# Patient Record
Sex: Male | Born: 1965 | Race: Black or African American | Hispanic: No | Marital: Married | Smoking: Current every day smoker
Health system: Southern US, Community
[De-identification: ages and names within clinical notes are randomized; demographics above are authoritative.]

## PROBLEM LIST (undated history)

## (undated) DIAGNOSIS — G459 Transient cerebral ischemic attack, unspecified: Secondary | ICD-10-CM

## (undated) DIAGNOSIS — I639 Cerebral infarction, unspecified: Secondary | ICD-10-CM

## (undated) DIAGNOSIS — I10 Essential (primary) hypertension: Secondary | ICD-10-CM

## (undated) DIAGNOSIS — C61 Malignant neoplasm of prostate: Secondary | ICD-10-CM

## (undated) DIAGNOSIS — I25118 Atherosclerotic heart disease of native coronary artery with other forms of angina pectoris: Secondary | ICD-10-CM

## (undated) DIAGNOSIS — G4730 Sleep apnea, unspecified: Secondary | ICD-10-CM

## (undated) HISTORY — PX: TONSILLECTOMY: SUR1361

## (undated) HISTORY — PX: COLONOSCOPY: SHX174

## (undated) HISTORY — PX: PROSTATE BIOPSY: SHX241

## (undated) HISTORY — PX: JOINT REPLACEMENT: SHX530

---

## 2008-07-13 ENCOUNTER — Ambulatory Visit: Payer: Self-pay | Admitting: Internal Medicine

## 2013-04-16 HISTORY — PX: HIP SURGERY: SHX245

## 2014-03-24 ENCOUNTER — Ambulatory Visit: Payer: Self-pay | Admitting: Orthopedic Surgery

## 2014-03-24 LAB — BASIC METABOLIC PANEL
ANION GAP: 8 (ref 7–16)
BUN: 12 mg/dL (ref 7–18)
CALCIUM: 8.7 mg/dL (ref 8.5–10.1)
CREATININE: 1.04 mg/dL (ref 0.60–1.30)
Chloride: 105 mmol/L (ref 98–107)
Co2: 25 mmol/L (ref 21–32)
EGFR (African American): >60
EGFR (Non-African Amer.): >60
GLUCOSE: 90 mg/dL (ref 65–99)
OSMOLALITY: 275 (ref 275–301)
POTASSIUM: 4.5 mmol/L (ref 3.5–5.1)
SODIUM: 138 mmol/L (ref 136–145)

## 2014-03-24 LAB — CBC
HCT: 40.7 % (ref 40.0–52.0)
HGB: 13.6 g/dL (ref 13.0–18.0)
MCH: 30.4 pg (ref 26.0–34.0)
MCHC: 33.3 g/dL (ref 32.0–36.0)
MCV: 91 fL (ref 80–100)
PLATELETS: 210 10*3/uL (ref 150–440)
RBC: 4.46 10*6/uL (ref 4.40–5.90)
RDW: 14.7 % — ABNORMAL HIGH (ref 11.5–14.5)
WBC: 4.6 10*3/uL (ref 3.8–10.6)

## 2014-03-24 LAB — URINALYSIS, COMPLETE
BACTERIA: NONE SEEN
Bilirubin,UR: NEGATIVE
Blood: NEGATIVE
Glucose,UR: NEGATIVE mg/dL (ref 0–75)
KETONE: NEGATIVE
Leukocyte Esterase: NEGATIVE
Nitrite: NEGATIVE
PROTEIN: NEGATIVE
Ph: 5 (ref 4.5–8.0)
SPECIFIC GRAVITY: 1.012 (ref 1.003–1.030)
Squamous Epithelial: NONE SEEN
WBC UR: 1 /HPF (ref 0–5)

## 2014-03-24 LAB — PROTIME-INR
INR: 0.9
Prothrombin Time: 12.1 secs (ref 11.5–14.7)

## 2014-03-24 LAB — SEDIMENTATION RATE: ERYTHROCYTE SED RATE: 28 mm/h — AB (ref 0–15)

## 2014-03-24 LAB — APTT: Activated PTT: 31.5 secs (ref 23.6–35.9)

## 2014-03-24 LAB — MRSA PCR SCREENING

## 2014-04-01 ENCOUNTER — Inpatient Hospital Stay: Payer: Self-pay | Admitting: Orthopedic Surgery

## 2014-04-02 LAB — BASIC METABOLIC PANEL
ANION GAP: 8 (ref 7–16)
BUN: 12 mg/dL (ref 7–18)
CHLORIDE: 106 mmol/L (ref 98–107)
CREATININE: 0.99 mg/dL (ref 0.60–1.30)
Calcium, Total: 7.8 mg/dL — ABNORMAL LOW (ref 8.5–10.1)
Co2: 24 mmol/L (ref 21–32)
EGFR (Non-African Amer.): >60
Glucose: 95 mg/dL (ref 65–99)
Osmolality: 275 (ref 275–301)
Potassium: 4 mmol/L (ref 3.5–5.1)
SODIUM: 138 mmol/L (ref 136–145)

## 2014-04-02 LAB — PLATELET COUNT: PLATELETS: 185 10*3/uL (ref 150–440)

## 2014-04-02 LAB — HEMOGLOBIN: HGB: 11.6 g/dL — ABNORMAL LOW (ref 13.0–18.0)

## 2014-04-26 ENCOUNTER — Encounter: Payer: Self-pay | Admitting: Orthopedic Surgery

## 2014-05-17 ENCOUNTER — Encounter: Payer: Self-pay | Admitting: Orthopedic Surgery

## 2014-08-07 NOTE — Op Note (Signed)
PATIENT NAME:  Bradley Shaw, Bradley Shaw MR#:  [id number] DATE OF BIRTH:  [birth year]  DATE OF PROCEDURE:  04/01/2014  PREOPERATIVE DIAGNOSIS: Right hip severe osteoarthritis.   POSTOPERATIVE DIAGNOSIS:  Right hip severe osteoarthritis.  PROCEDURE: Right total hip replacement, direct anterior approach.   ANESTHESIA: Spinal.   SURGEON: Hessie Knows, MD   DESCRIPTION OF PROCEDURE: The patient was brought to the Operating Room and after adequate spinal anesthesia was obtained, the patient was placed on the operative table with the right foot in the Medacta attachment, left foot on a well-padded table.  Preop x-rays were taken of both sides because of the severe deformity on the right side for subsequent templating. After prepping and draping in the usual sterile fashion, appropriate patient identification and timeout procedures were completed.  Direct anterior approach was made, centered over the TFL and greater trochanter with C-arm being brought to help localize.  Case was made more difficult by the patient's size and muscularity, as well as a very stiff hip.  After exposing the TFL the tensor was retracted laterally and deep retractors placed. The fascia adjacent to the quadriceps muscles was exposed and the large lateral femoral circumflex vessels were identified and ligated.   Next, the anterior capsule was exposed and a capsulotomy performed. There was a large loose body anteriorly.  Femoral neck cut was carried out with a subsequent and second cut to allow for adequate exposure of the neck. The head was very difficult to remove because there was essentially no motion in the acetabulum with the head.  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 This was also true on the acetabular side with significant deformity bone loss of sclerotic bone.  Labrum was excised and reaming carried out to 52 mm, at which point there was good bleeding bone. A 52 mm trial  fit well.  A 52 mm cup was impacted into place.   Attention was then turned to the femur.  Pubofemoral and extensive ischiofemoral releases were required because of the patient's stiffness, also because of his dense bone, it was difficult to get started in the canal, but after working on getting the canal opened and using offset handle, I was able to subsequently broach up to the size 3 which was the appropriate size with a small head on trial with the appropriate Versafit Cup trial.  With the 3 stem in place, a size S head and 52 mm Versafit Cup liner were assembled, impacted onto the stem and the hip was reduced.  AP images with the leg in external and internal rotation showed good component position. The wound was thoroughly irrigated and the hip closed with a running heavy Quill suture for the superficial fascia over the tensor.  Drains were placed and then the skin was closed with 2-0 Vicryl subcutaneously and skin staples, followed by a sterile dressing.  The patient was sent to the recovery room in stable condition.   ESTIMATED BLOOD LOSS: 500 mL.   COMPLICATIONS: None.   SPECIMEN: Removed loose bodies and femoral head.   IMPLANTS: Medacta Versafit cup DM, 52 mm with associated liner and a size 3 standard Amis stem S 28 mm head.  CONDITION:  Stable to recovery room.      ____________________________ Laurene Footman, MD mjm:DT D: 04/01/2014 23:08:06 ET T: 04/02/2014 09:26:05 ET JOB#: [id number]  cc: Laurene Footman, MD, <Dictator> Laurene Footman MD ELECTRONICALLY SIGNED 04/02/2014 13:11

## 2014-08-09 LAB — SURGICAL PATHOLOGY

## 2014-08-11 NOTE — H&P (Signed)
PATIENT NAME:  Bradley Shaw, Bradley Shaw MR#:  [id number] DATE OF BIRTH:  [birth year]  DATE OF ADMISSION:  04/01/2014  DATE OF DISCHARGE: 04/05/1999.   ADMITTING DIAGNOSIS: Right hip severe osteoarthritis.   DISCHARGE DIAGNOSIS: Right hip severe osteoarthritis.   OPERATION: On 04/01/2014, the patient had a right total hip replacement using anterior approach.   ANESTHESIA: Spinal.   ESTIMATED BLOOD LOSS: 500 mL.   COMPLICATIONS: None.  IMPLANTS USED:  Medacta Versafit cup DM, 52 mm with associated liner and a size three standard AMI Stem S 28 mm head. The patient was stabilized, brought to the recovery room, and then brought down to the orthopedic floor.   HISTORY: The patient is a 50 year old male that presented for persistent severe pain involving his right hip and leg. The patient has been refractory to conservative treatment with activity modifications and has had no cortisone injections.   PHYSICAL EXAMINATION:  GENERAL: Alert male with severe limp and a very slow gait. The patient has a hip lurch on the right.   CARDIAC: Regular rate and rhythm.   LUNGS: Clear to auscultation.   MUSCULOSKELETAL: In regard to the right leg the patient has anterior hip joint pain. The patient has 90 degrees flexion with -30 degrees extension. The patient has internal rotation of 30 degrees and external rotation and 30 degrees with pain. The patient has a benign knee and ankle exam with neurovascular intact.   HOSPITAL COURSE: After initial admission on 04/01/2014, the patient was brought to the orthopedic floor. On postoperative day one, the patient had a hemoglobin of 11.6 which remained stable there. The patient had no drain use.  The patient was able to ambulate with physical therapy, initially bed to chair and progressed up to ambulating over 200 feet, including doing stairs before discharge. The patient was ready to go home with home health physical therapy on 04/04/2014.   CONDITION AT DISCHARGE:  Stable.   DISPOSITION: The patient was sent home with home health physical therapy.   DISCHARGE INSTRUCTIONS: The patient will follow up at [place] in two weeks. The patient will do weight bear as tolerated on the affected leg. The patient will elevate the leg on 1 to 2 pillows and use knee-high TED hose on both legs, removed at bedtime. The patient will elevate his heels off the bed and use incentive spirometer, as well as do cough and deep breathing. The patient's diet is regular. The patient will try to keep his dressing clean and dry and will have it changed by physical therapy if needed. The patient will call the clinic if there is any bright red bleeding or calf pain, or bowel or bladder difficulty. The patient will follow-up with [facility] orthopedics in two weeks.   DISCHARGE MEDICATIONS:  Tylenol 500 mg 1 tablet every four hours as needed for fever, oxycodone 5 mg 1 tablet q.4 hours as needed for moderate to severe pain and Lovenox 40 mg subcutaneous once a day for 14 days and then discontinue and begin aspirin 81 mg once a day.   ____________________________ Lenna Sciara. Reche Dixon, [place] jtm:at D: 04/04/2014 06:47:31 ET T: 04/04/2014 17:09:36 ET JOB#: [id number]  cc: J. Reche Dixon, [place], <Dictator> J Aleyza Salmi [facility] [place] ELECTRONICALLY SIGNED 04/05/2014 8:24

## 2014-08-15 NOTE — Discharge Summary (Signed)
PATIENT NAME:  Bradley Shaw, Bradley Shaw MR#:  [id number] DATE OF BIRTH:  1965-12-07  DATE OF ADMISSION:  04/01/2014 DATE OF DISCHARGE: 04/04/2014   ADMITTING DIAGNOSIS: Right hip severe osteoarthritis.   DISCHARGE DIAGNOSIS: Right hip severe osteoarthritis.   OPERATION: On 04/01/2014, the patient had a right total hip replacement using anterior approach.   ANESTHESIA: Spinal.   ESTIMATED BLOOD LOSS: 500 mL.   COMPLICATIONS: None.  IMPLANTS USED:  Medacta Versafit cup DM, 52 mm with associated liner and a size three standard AMI Stem S 28 mm head. The patient was stabilized, brought to the recovery room, and then brought down to the orthopedic floor.   HISTORY: The patient is a 49 year old male that presented for persistent severe pain involving his right hip and leg. The patient has been refractory to conservative treatment with activity modifications and has had no cortisone injections.   PHYSICAL EXAMINATION:  GENERAL: Alert male with severe limp and a very slow gait. The patient has a hip lurch on the right.   CARDIAC: Regular rate and rhythm.   LUNGS: Clear to auscultation.   MUSCULOSKELETAL: In regard to the right leg the patient has anterior hip joint pain. The patient has 90 degrees flexion with -30 degrees extension. The patient has internal rotation of 30 degrees and external rotation and 30 degrees with pain. The patient has a benign knee and ankle exam with neurovascular intact.   HOSPITAL COURSE: After initial admission on 04/01/2014, the patient was brought to the orthopedic floor. On postoperative day one, the patient had a hemoglobin of 11.6 which remained stable there. The patient had no drain use.  The patient was able to ambulate with physical therapy, initially bed to chair and progressed up to ambulating over 200 feet, including doing stairs before discharge. The patient was ready to go home with home health physical therapy on 04/04/2014.   CONDITION AT DISCHARGE: Stable.    DISPOSITION: The patient was sent home with home health physical therapy.   DISCHARGE INSTRUCTIONS: The patient will follow up at [facility] in two weeks. The patient will do weight bear as tolerated on the affected leg. The patient will elevate the leg on 1 to 2 pillows and use knee-high TED hose on both legs, removed at bedtime. The patient will elevate his heels off the bed and use incentive spirometer, as well as do cough and deep breathing. The patient's diet is regular. The patient will try to keep his dressing clean and dry and will have it changed by physical therapy if needed. The patient will call the clinic if there is any bright red bleeding or calf pain, or bowel or bladder difficulty. The patient will follow-up with [facility] orthopedics in two weeks.   DISCHARGE MEDICATIONS:  Tylenol 500 mg 1 tablet every four hours as needed for fever, oxycodone 5 mg 1 tablet q.4 hours as needed for moderate to severe pain and Lovenox 40 mg subcutaneous once a day for 14 days and then discontinue and begin aspirin 81 mg once a day.   ____________________________ Lenna Sciara. Reche Dixon, [place] jtm:at D: 04/04/2014 06:47:31 ET T: 04/04/2014 17:09:36 ET JOB#: [id number]  cc: J. Reche Dixon, [place], <Dictator> J Jerral Mccauley [facility] PA ELECTRONICALLY SIGNED 04/05/2014 8:24

## 2015-06-09 ENCOUNTER — Other Ambulatory Visit: Payer: Self-pay | Admitting: Family Medicine

## 2015-06-09 ENCOUNTER — Ambulatory Visit
Admission: RE | Admit: 2015-06-09 | Discharge: 2015-06-09 | Disposition: A | Discharge status: Home / Self Care | Payer: Managed Care, Other (non HMO) | Source: Ambulatory Visit | Attending: Family Medicine | Admitting: Family Medicine

## 2015-06-09 DIAGNOSIS — Z96641 Presence of right artificial hip joint: Secondary | ICD-10-CM | POA: Insufficient documentation

## 2015-06-09 DIAGNOSIS — M25551 Pain in right hip: Secondary | ICD-10-CM

## 2015-06-22 ENCOUNTER — Other Ambulatory Visit: Payer: Self-pay | Admitting: Orthopedic Surgery

## 2015-06-22 DIAGNOSIS — Z96641 Presence of right artificial hip joint: Secondary | ICD-10-CM

## 2015-06-22 DIAGNOSIS — M25551 Pain in right hip: Secondary | ICD-10-CM

## 2015-06-27 ENCOUNTER — Ambulatory Visit
Admission: RE | Admit: 2015-06-27 | Discharge: 2015-06-27 | Disposition: A | Discharge status: Home / Self Care | Payer: Managed Care, Other (non HMO) | Source: Ambulatory Visit | Attending: Orthopedic Surgery | Admitting: Orthopedic Surgery

## 2015-06-27 ENCOUNTER — Encounter
Admission: RE | Admit: 2015-06-27 | Discharge: 2015-06-27 | Disposition: A | Discharge status: Home / Self Care | Payer: Managed Care, Other (non HMO) | Source: Ambulatory Visit | Attending: Orthopedic Surgery | Admitting: Orthopedic Surgery

## 2015-06-27 DIAGNOSIS — Z96641 Presence of right artificial hip joint: Secondary | ICD-10-CM | POA: Insufficient documentation

## 2015-06-27 DIAGNOSIS — M25551 Pain in right hip: Secondary | ICD-10-CM | POA: Insufficient documentation

## 2015-06-27 MED ORDER — TECHNETIUM TC 99M MEDRONATE IV KIT
23.7000 | PACK | Freq: Once | INTRAVENOUS | Status: AC | PRN
  Administered 2015-06-27: 23.7 via INTRAVENOUS

## 2015-12-28 DIAGNOSIS — E669 Obesity, unspecified: Secondary | ICD-10-CM | POA: Insufficient documentation

## 2016-01-11 ENCOUNTER — Encounter: Payer: Self-pay | Admitting: Urology

## 2016-01-11 ENCOUNTER — Ambulatory Visit (INDEPENDENT_AMBULATORY_CARE_PROVIDER_SITE_OTHER): Payer: Managed Care, Other (non HMO) | Admitting: Urology

## 2016-01-11 VITALS — BP 130/78 | HR 89 | Ht 68.0 in | Wt 265.3 lb

## 2016-01-11 DIAGNOSIS — R972 Elevated prostate specific antigen [PSA]: Secondary | ICD-10-CM

## 2016-01-11 LAB — URINALYSIS, COMPLETE
BILIRUBIN UA: NEGATIVE
GLUCOSE, UA: NEGATIVE
KETONES UA: NEGATIVE
Leukocytes, UA: NEGATIVE
NITRITE UA: NEGATIVE
Protein, UA: NEGATIVE
RBC UA: NEGATIVE
SPEC GRAV UA: 1.025 (ref 1.005–1.030)
UUROB: 1 mg/dL (ref 0.2–1.0)
pH, UA: 6.5 (ref 5.0–7.5)

## 2016-01-11 LAB — MICROSCOPIC EXAMINATION: Bacteria, UA: NONE SEEN

## 2016-01-11 NOTE — Progress Notes (Signed)
01/11/2016 12:27 PM   Bradley Shaw 03-31-1966 [id number]  Referring provider: Valera Castle, MD [address] [place], [place] [address]  Chief Complaint  Patient presents with  . New Patient (Initial Visit)    elevated PSA    HPI: Pt seen today for elevated PSA. His Dec 28, 2015 PSA was 12.47. He does not recall prior PSA or prior elevated PSA. No FH PCa. He drives a fork lift. He has no LUTS. He tried Viagra for ED a week ago. It helped some. He's had trouble maintaining an erection for about a year.   He has three kids.   UA today clear, normal.   PMH: No past medical history on file.  Surgical History: Past Surgical History:  Procedure Laterality Date  . HIP SURGERY Right 2015    Home Medications:    Medication List       Accurate as of 01/11/16 12:27 PM. Always use your most recent med list.          meloxicam 15 MG tablet Commonly known as:  MOBIC   sildenafil 20 MG tablet Commonly known as:  REVATIO May use 1-5 tabs daily 1/2 hr before intercourse       Allergies:  Allergies  Allergen Reactions  . Shellfish Allergy Anaphylaxis    Hives & throat closes    Family History: Family History  Problem Relation Age of Onset  . Prostate cancer Neg Hx     Social History:  reports that he has been smoking.  He has never used smokeless tobacco. He reports that he drinks alcohol. He reports that he does not use drugs.  ROS: UROLOGY Frequent Urination?: No Hard to postpone urination?: No Burning/pain with urination?: No Get up at night to urinate?: No Leakage of urine?: No Urine stream starts and stops?: No Trouble starting stream?: No Do you have to strain to urinate?: No Blood in urine?: No Urinary tract infection?: No Sexually transmitted disease?: Yes Injury to kidneys or bladder?: No Painful intercourse?: No Weak stream?: No Erection problems?: No Penile pain?: No  Gastrointestinal Nausea?: No Vomiting?:  No Indigestion/heartburn?: No Diarrhea?: No Constipation?: No  Constitutional Fever: No Night sweats?: No Weight loss?: No Fatigue?: No  Skin Skin rash/lesions?: No Itching?: No  Eyes Blurred vision?: No Double vision?: No  Ears/Nose/Throat Sore throat?: No Sinus problems?: No  Hematologic/Lymphatic Swollen glands?: No Easy bruising?: No  Cardiovascular Leg swelling?: No Chest pain?: No  Respiratory Cough?: No Shortness of breath?: No  Endocrine Excessive thirst?: No  Musculoskeletal Back pain?: No Joint pain?: No  Neurological Headaches?: No Dizziness?: No  Psychologic Depression?: No Anxiety?: No  Physical Exam: BP 130/78   Pulse 89   Ht 5\' 8"  (1.727 m)   Wt 120.3 kg (265 lb 4.8 oz)   BMI 40.34 kg/m   Constitutional:  Alert and oriented, No acute distress. HEENT: [place] AT, moist mucus membranes.  Trachea midline, no masses. Cardiovascular: No clubbing, cyanosis, or edema. Respiratory: Normal respiratory effort, no increased work of breathing. GI: Abdomen is soft, nontender, nondistended, no abdominal masses Skin: No rashes, bruises or suspicious lesions. Lymph: No cervical or inguinal adenopathy. Neurologic: Grossly intact, no focal deficits, moving all 4 extremities. Psychiatric: Normal mood and affect. DRE- prostate 30 grams, no hard area or nodule. Landmarks preserved.   Laboratory Data: Lab Results  Component Value Date   WBC 4.6 03/24/2014   HGB 11.6 (L) 04/02/2014   HCT 40.7 03/24/2014   MCV 91 03/24/2014  PLT 185 04/02/2014    Lab Results  Component Value Date   CREATININE 0.99 04/02/2014    No results found for: PSA  No results found for: TESTOSTERONE  No results found for: HGBA1C  Urinalysis    Component Value Date/Time   COLORURINE Yellow 03/24/2014 0904   APPEARANCEUR Clear 03/24/2014 0904   LABSPEC 1.012 03/24/2014 0904   PHURINE 5.0 03/24/2014 0904   GLUCOSEU Negative 03/24/2014 0904   HGBUR Negative  03/24/2014 0904   BILIRUBINUR Negative 03/24/2014 0904   KETONESUR Negative 03/24/2014 0904   PROTEINUR Negative 03/24/2014 0904   NITRITE Negative 03/24/2014 0904   LEUKOCYTESUR Negative 03/24/2014 0904     Assessment & Plan:    1) PSA elevation - discussed with pt nature of elevated PSA and potential etiologies - cancer vs benign. Discussed age specific levels - 2.5. Discussed nature r/b of surveillance, other labs, prostate MRI, and prostate biopsy. Discussed management of PCa might include AS vs treatment with surgery or radiation. Discussed tx can have a lot of side effects - LUTS, worse ED, etc. Will recheck PSA in 2 weeks with 48 hrs abstinence. If PSA     There are no diagnoses linked to this encounter.  No Follow-up on file.  Festus Aloe, [facility] [address], [place], [place] [address] [phone]

## 2016-01-27 ENCOUNTER — Other Ambulatory Visit: Payer: Managed Care, Other (non HMO)

## 2016-03-12 ENCOUNTER — Other Ambulatory Visit: Payer: Managed Care, Other (non HMO)

## 2016-03-16 ENCOUNTER — Other Ambulatory Visit: Payer: Managed Care, Other (non HMO)

## 2016-03-16 DIAGNOSIS — R972 Elevated prostate specific antigen [PSA]: Secondary | ICD-10-CM

## 2016-03-17 LAB — PSA TOTAL (REFLEX TO FREE): Prostate Specific Ag, Serum: 13.6 ng/mL — ABNORMAL HIGH (ref 0.0–4.0)

## 2016-03-21 ENCOUNTER — Telehealth: Payer: Self-pay

## 2016-03-21 NOTE — Telephone Encounter (Signed)
Bradley Aloe, MD  Lestine Box, LPN        Notify patient his PSA has risen to 13.6. I recommend we proceed with prostate biopsy as discussed.   Please schedule for a prostate biopsy.   Thanks.    LMOM

## 2016-03-21 NOTE — Telephone Encounter (Signed)
Spoke with pt in reference to PSA results and needing a prostate bx. Pt voiced understanding. Pt was transferred to the front to make bx appt and cancel upcoming appt.

## 2016-03-26 ENCOUNTER — Ambulatory Visit: Payer: Managed Care, Other (non HMO)

## 2016-04-23 ENCOUNTER — Ambulatory Visit (INDEPENDENT_AMBULATORY_CARE_PROVIDER_SITE_OTHER): Payer: Managed Care, Other (non HMO) | Admitting: Urology

## 2016-04-23 ENCOUNTER — Encounter: Payer: Self-pay | Admitting: Urology

## 2016-04-23 ENCOUNTER — Other Ambulatory Visit: Payer: Self-pay | Admitting: Urology

## 2016-04-23 VITALS — BP 121/77 | HR 75 | Ht 67.0 in | Wt 246.2 lb

## 2016-04-23 DIAGNOSIS — R972 Elevated prostate specific antigen [PSA]: Secondary | ICD-10-CM | POA: Diagnosis not present

## 2016-04-23 DIAGNOSIS — N5201 Erectile dysfunction due to arterial insufficiency: Secondary | ICD-10-CM | POA: Diagnosis not present

## 2016-04-23 MED ORDER — GENTAMICIN SULFATE 40 MG/ML IJ SOLN
80.0000 mg | Freq: Once | INTRAMUSCULAR | Status: AC
  Administered 2016-04-23: 80 mg via INTRAMUSCULAR

## 2016-04-23 MED ORDER — LIDOCAINE HCL 2 % EX GEL
1.0000 "application " | Freq: Once | CUTANEOUS | Status: AC
  Administered 2016-04-23: 1 via URETHRAL

## 2016-04-23 MED ORDER — LEVOFLOXACIN 500 MG PO TABS
500.0000 mg | ORAL_TABLET | Freq: Once | ORAL | Status: AC
  Administered 2016-04-23: 500 mg via ORAL

## 2016-04-23 NOTE — Progress Notes (Signed)
04/23/2016 5:46 AM   Bradley Shaw 04/08/1966 [id number]  Referring provider: Valera Castle, MD [place], [place] [address]  CC: prostate biopsy  HPI:   1. Elevated PSA - No [facility] prostate cancer. 12/2015 - PSA 12.47 (pcp labs) ===> 03/2016 recheck 13.6 ===> 04/2016 TRUS BX 34 mL, no median lobe  2 - Erectile Dysfunction - on sildenafil prn for trouble maintaining erection. Libido preserved.  Today Bradley Shaw is seen to proceed with prostate biopsy for persistently elevated PSA.   PMH: No past medical history on file.  Surgical History: Past Surgical History:  Procedure Laterality Date  . HIP SURGERY Right 2015    Home Medications:  Allergies as of 04/23/2016      Reactions   Shellfish Allergy Anaphylaxis   Hives & throat closes      Medication List       Accurate as of 04/23/16  5:46 AM. Always use your most recent med list.          meloxicam 15 MG tablet Commonly known as:  MOBIC   sildenafil 20 MG tablet Commonly known as:  REVATIO May use 1-5 tabs daily 1/2 hr before intercourse       Allergies:  Allergies  Allergen Reactions  . Shellfish Allergy Anaphylaxis    Hives & throat closes    Family History: Family History  Problem Relation Age of Onset  . Prostate cancer Neg Hx     Social History:  reports that he has been smoking.  He has never used smokeless tobacco. He reports that he drinks alcohol. He reports that he does not use drugs.    Review of Systems  Gastrointestinal (upper)  : Negative for upper GI symptoms  Gastrointestinal (lower) : Negative for lower GI symptoms  Constitutional : Negative for symptoms  Skin: Negative for skin symptoms  Eyes: Negative for eye symptoms  Ear/Nose/Throat : Negative for Ear/Nose/Throat symptoms  Hematologic/Lymphatic: Negative for Hematologic/Lymphatic symptoms  Cardiovascular : Negative for cardiovascular symptoms  Respiratory : Negative for respiratory  symptoms  Endocrine: Negative for endocrine symptoms  Musculoskeletal: Negative for musculoskeletal symptoms  Neurological: Negative for neurological symptoms  Psychologic: Negative for psychiatric symptoms  Physical Exam: There were no vitals taken for this visit.  Constitutional:  Alert and oriented, No acute distress. HEENT: [place] AT, moist mucus membranes.  Trachea midline, no masses. Cardiovascular: No clubbing, cyanosis, or edema. Respiratory: Normal respiratory effort, no increased work of breathing. GI: Abdomen is soft, nontender, nondistended, no abdominal masses GU: No CVA tenderness.  Skin: No rashes, bruises or suspicious lesions. Lymph: No cervical or inguinal adenopathy. Neurologic: Grossly intact, no focal deficits, moving all 4 extremities. Psychiatric: Normal mood and affect.  Laboratory Data: Lab Results  Component Value Date   WBC 4.6 03/24/2014   HGB 11.6 (L) 04/02/2014   HCT 40.7 03/24/2014   MCV 91 03/24/2014   PLT 185 04/02/2014    Lab Results  Component Value Date   CREATININE 0.99 04/02/2014    No results found for: PSA  No results found for: TESTOSTERONE  No results found for: HGBA1C  Urinalysis    Component Value Date/Time   COLORURINE Yellow 03/24/2014 0904   APPEARANCEUR Clear 01/11/2016 1331   LABSPEC 1.012 03/24/2014 0904   PHURINE 5.0 03/24/2014 0904   GLUCOSEU Negative 01/11/2016 1331   GLUCOSEU Negative 03/24/2014 0904   HGBUR Negative 03/24/2014 0904   BILIRUBINUR Negative 01/11/2016 1331   BILIRUBINUR Negative 03/24/2014 0904  KETONESUR Negative 03/24/2014 0904   PROTEINUR Negative 01/11/2016 1331   PROTEINUR Negative 03/24/2014 0904   NITRITE Negative 01/11/2016 1331   NITRITE Negative 03/24/2014 0904   LEUKOCYTESUR Negative 01/11/2016 1331   LEUKOCYTESUR Negative 03/24/2014 0904    Prostate Biopsy Procedure   Informed consent was obtained after discussing risks/benefits of the procedure.  A time out was  performed to ensure correct patient identity.  Pre-Procedure: - Last PSA Level: No results found for: PSA - Gentamicin given prophylactically - Levaquin 500 mg administered PO -Transrectal Ultrasound performed revealing a 34 gm prostate -No significant hypoechoic or median lobe noted  Procedure: - Prostate block performed using 10 cc 1% lidocaine and biopsies taken from sextant areas, a total of 12 under ultrasound guidance.  Post-Procedure: - Patient tolerated the procedure well - He was counseled to seek immediate medical attention if experiences any severe pain, significant bleeding, or fevers - Return in one week to discuss biopsy results   Assessment & Plan:   1. Elevated PSA - s/p biopsy today as per above. Warned to contact MD for fever >101, perisstantly bleeding with dizziness, or inability to void. RTC for results discussion in 1-2 weeks.   2. Erectile dysfunction due to arterial insufficiency - continue sildenafil PRN  Alexis Frock, [place] [address], [place], [place] [address] [phone]

## 2016-04-25 LAB — PATHOLOGY REPORT

## 2016-04-30 ENCOUNTER — Other Ambulatory Visit: Payer: Self-pay | Admitting: Urology

## 2016-05-01 ENCOUNTER — Ambulatory Visit (INDEPENDENT_AMBULATORY_CARE_PROVIDER_SITE_OTHER): Payer: Managed Care, Other (non HMO) | Admitting: Urology

## 2016-05-01 ENCOUNTER — Ambulatory Visit: Payer: Managed Care, Other (non HMO)

## 2016-05-01 ENCOUNTER — Encounter: Payer: Self-pay | Admitting: Urology

## 2016-05-01 VITALS — BP 133/82 | HR 80 | Ht 67.0 in | Wt 248.2 lb

## 2016-05-01 DIAGNOSIS — C61 Malignant neoplasm of prostate: Secondary | ICD-10-CM | POA: Insufficient documentation

## 2016-05-01 DIAGNOSIS — N5201 Erectile dysfunction due to arterial insufficiency: Secondary | ICD-10-CM | POA: Diagnosis not present

## 2016-05-01 DIAGNOSIS — R972 Elevated prostate specific antigen [PSA]: Secondary | ICD-10-CM | POA: Diagnosis not present

## 2016-05-01 NOTE — Progress Notes (Signed)
05/01/2016 10:18 AM   [name] 1965/05/23 [id number]  Referring provider: Valera Castle, MD [address] [place], [place] [address]  [id number] up / discuss prostate cance.r   HPI:   1. 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 Bilateral apical, lateral and base positivity.   2 - Erectile Dysfunction - on sildenafil prn for trouble maintaining erection. Libido preserved.  Today Mr. Piersol is seen in f/u above and discuss new large volume prostate cancer. He is nearly completely back to baseline following recent biopsy.   PMH: No past medical history on file.  Surgical History: Past Surgical History:  Procedure Laterality Date  . HIP SURGERY Right 2015    Home Medications:  Allergies as of 05/01/2016      Reactions   Shellfish Allergy Anaphylaxis   Hives & throat closes      Medication List       Accurate as of 05/01/16 10:18 AM. Always use your most recent med list.          meloxicam 15 MG tablet Commonly known as:  MOBIC   sildenafil 20 MG tablet Commonly known as:  REVATIO May use 1-5 tabs daily 1/2 hr before intercourse       Allergies:  Allergies  Allergen Reactions  . Shellfish Allergy Anaphylaxis    Hives & throat closes    Family History: Family History  Problem Relation Age of Onset  . Prostate cancer Neg Hx     Social History:  reports that he has been smoking.  He has never used smokeless tobacco. He reports that he drinks alcohol. He reports that he does not use drugs.    Review of Systems  Gastrointestinal (upper)  : Negative for upper GI symptoms  Gastrointestinal (lower) : Negative for lower GI symptoms  Constitutional : Negative for symptoms  Skin: Negative for skin symptoms  Eyes: Negative for eye symptoms  Ear/Nose/Throat : Negative for Ear/Nose/Throat  symptoms  Hematologic/Lymphatic: Negative for Hematologic/Lymphatic symptoms  Cardiovascular : Negative for cardiovascular symptoms  Respiratory : Negative for respiratory symptoms  Endocrine: Negative for endocrine symptoms  Musculoskeletal: Negative for musculoskeletal symptoms  Neurological: Negative for neurological symptoms  Psychologic: Negative for psychiatric symptoms  Physical Exam: There were no vitals taken for this visit.  Constitutional:  Alert and oriented, No acute distress. HEENT: [place] AT, moist mucus membranes.  Trachea midline, no masses. Cardiovascular: No clubbing, cyanosis, or edema. Respiratory: Normal respiratory effort, no increased work of breathing. GI: Abdomen is soft, nontender, nondistended, no abdominal masses GU: No CVA tenderness.  Skin: No rashes, bruises or suspicious lesions. Lymph: No cervical or inguinal adenopathy. Neurologic: Grossly intact, no focal deficits, moving all 4 extremities. Psychiatric: Normal mood and affect.  Laboratory Data: Lab Results  Component Value Date   WBC 4.6 03/24/2014   HGB 11.6 (L) 04/02/2014   HCT 40.7 03/24/2014   MCV 91 03/24/2014   PLT 185 04/02/2014    Lab Results  Component Value Date   CREATININE 0.99 04/02/2014    No results found for: PSA  No results found for: TESTOSTERONE  No results found for: HGBA1C  Urinalysis    Component Value Date/Time   COLORURINE Yellow 03/24/2014 0904   APPEARANCEUR Clear 01/11/2016 1331   LABSPEC 1.012 03/24/2014 0904   PHURINE 5.0 03/24/2014 0904   GLUCOSEU Negative  01/11/2016 1331   GLUCOSEU Negative 03/24/2014 0904   HGBUR Negative 03/24/2014 0904   BILIRUBINUR Negative 01/11/2016 1331   BILIRUBINUR Negative 03/24/2014 0904   KETONESUR Negative 03/24/2014 0904   PROTEINUR Negative 01/11/2016 1331   PROTEINUR Negative 03/24/2014 0904   NITRITE Negative 01/11/2016 1331   NITRITE Negative 03/24/2014 0904   LEUKOCYTESUR Negative 01/11/2016 1331    LEUKOCYTESUR Negative 03/24/2014 0904     Assessment & Plan:   1. 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 Very high liklihood of need for eventual multimodal therapy regardelss of primary approach.  In terms of primary therapy, discussed radiation + 6mos ADT v. Surgery (non-nerve-sparing, with pelvic lymphadenectomy) in detail with surgery usually being preferred in men his age given somewhat more aggressive oncologically and less risk of long term secondary malignancy. Sexual function and urinary funciton side effects frankly discussed as well.   Offered referral to radiation oncology to discussed XRT/ADT in more detail, and recommended discussion with them, he is amenable.  2. Erectile dysfunction due to arterial insufficiency - continue sildenafil PRN, again discussed that this would be expected to worsen with any primary therapy with prostate cancer.  3 - RTC 4 weeks to rediscuss primary therapy options with rad-onc visit in interval.   Alexis Frock, [facility] [address], [place], [place] [address] [phone]

## 2016-05-09 ENCOUNTER — Telehealth: Payer: Self-pay | Admitting: Urology

## 2016-05-09 NOTE — Telephone Encounter (Signed)
-----   Message from Wallene Dales sent at 05/09/2016  8:20 AM EST ----- Regarding: Referral Patient declined to have the appt at the designated time. He stated he would call us back to reschedule once he made up time from work due to snow.

## 2016-05-14 ENCOUNTER — Institutional Professional Consult (permissible substitution): Payer: Managed Care, Other (non HMO) | Admitting: Radiation Oncology

## 2016-05-31 ENCOUNTER — Ambulatory Visit: Payer: Managed Care, Other (non HMO)

## 2016-06-01 ENCOUNTER — Telehealth: Payer: Self-pay

## 2016-06-01 NOTE — Telephone Encounter (Signed)
  Oncology Nurse Navigator Documentation Reached out to Bradley Shaw since he has not called to reschedule appointment with Dr. Baruch Gouty and noted that he does not have any follow up appts with Dr. Tresa Moore. Encouraged him to follow up regarding treatment whether it be here with Dr. Baruch Gouty or to follow up with Dr. Tresa Moore. He stated he would like to go ahead and make appt with Dr. Baruch Gouty as he has done some research and is interested in "seed placement". Appointment made by radiation for 2/19 at 0830. Navigator Location: [facility] (06/01/16 1300)   )Navigator Encounter Type: Telephone;Treatment (06/01/16 1300) Telephone: [place] Call (06/01/16 1300)                       Barriers/Navigation Needs: Coordination of Care (06/01/16 1300)   Interventions: Coordination of Care (06/01/16 1300)   Coordination of Care: Appts (06/01/16 1300)        Acuity: Level 2 (06/01/16 1300)   Acuity Level 2: Initial guidance, education and coordination as needed;Educational needs;Assistance expediting appointments;Ongoing guidance and education throughout treatment as needed (06/01/16 1300)     Time Spent with Patient: 15 (06/01/16 1300)

## 2016-06-05 ENCOUNTER — Encounter: Payer: Self-pay | Admitting: Radiation Oncology

## 2016-06-05 ENCOUNTER — Ambulatory Visit
Admission: RE | Admit: 2016-06-05 | Discharge: 2016-06-05 | Disposition: A | Discharge status: Home / Self Care | Payer: Managed Care, Other (non HMO) | Source: Ambulatory Visit | Attending: Radiation Oncology | Admitting: Radiation Oncology

## 2016-06-05 ENCOUNTER — Encounter (INDEPENDENT_AMBULATORY_CARE_PROVIDER_SITE_OTHER): Payer: Self-pay

## 2016-06-05 VITALS — BP 121/78 | HR 85 | Temp 98.7°F | Resp 18 | Wt 246.7 lb

## 2016-06-05 DIAGNOSIS — C61 Malignant neoplasm of prostate: Secondary | ICD-10-CM | POA: Insufficient documentation

## 2016-06-05 DIAGNOSIS — Z79899 Other long term (current) drug therapy: Secondary | ICD-10-CM | POA: Insufficient documentation

## 2016-06-05 NOTE — Consult Note (Signed)
NEW PATIENT EVALUATION  Name: Bradley Shaw  MRN: [id number]  Date:   06/05/2016     DOB: 09-Sep-1965   This 51 y.o. male patient presents to the clinic for initial evaluation of adenocarcinoma the prostate stage II Gleason score of 7 (3+4) presenting the PSA of 13.6.  REFERRING PHYSICIAN: Valera Castle, *  CHIEF COMPLAINT:  Chief Complaint  Patient presents with  . Prostate Cancer    Pt is here for initial consultation of prostate cancer.       DIAGNOSIS: The encounter diagnosis was Malignant neoplasm of prostate ([place]).   PREVIOUS INVESTIGATIONS:  Bone scan reviewed Clinical notes reviewed Pathology reports reviewed  HPI: Patient is a 51 year old male who presented with a PSA of 13.6. He was seen by urology which prompted a transrectal ultrasound-guided biopsy. 8 of 12 cores were positive for Gleason 7 (3+4) adenocarcinoma prostate measured 34.0 cc. Patient does have some erectile dysfunction is on sildenafil. He has been seen by urology and treatment options have been discussed. He has very little lower urinary tract symptoms. He has no previous history of abdominal surgery. He is seen today for radiation oncology opinion.  PLANNED TREATMENT REGIMEN: Lupron plus external beam radiation therapy plus I-125 interstitial implant for boost  PAST MEDICAL HISTORY:  has no past medical history on file.    PAST SURGICAL HISTORY:  Past Surgical History:  Procedure Laterality Date  . HIP SURGERY Right 2015    FAMILY HISTORY: family history is not on file.  SOCIAL HISTORY:  reports that he has been smoking.  He has never used smokeless tobacco. He reports that he drinks alcohol. He reports that he does not use drugs.  ALLERGIES: Shellfish allergy  MEDICATIONS:  Current Outpatient Prescriptions  Medication Sig Dispense Refill  . meloxicam (MOBIC) 15 MG tablet     . sildenafil (REVATIO) 20 MG tablet May use 1-5 tabs daily 1/2 hr before intercourse     No current  facility-administered medications for this encounter.     ECOG PERFORMANCE STATUS:  0 - Asymptomatic  REVIEW OF SYSTEMS:  Patient denies any weight loss, fatigue, weakness, fever, chills or night sweats. Patient denies any loss of vision, blurred vision. Patient denies any ringing  of the ears or hearing loss. No irregular heartbeat. Patient denies heart murmur or history of fainting. Patient denies any chest pain or pain radiating to her upper extremities. Patient denies any shortness of breath, difficulty breathing at night, cough or hemoptysis. Patient denies any swelling in the lower legs. Patient denies any nausea vomiting, vomiting of blood, or coffee ground material in the vomitus. Patient denies any stomach pain. Patient states has had normal bowel movements no significant constipation or diarrhea. Patient denies any dysuria, hematuria or significant nocturia. Patient denies any problems walking, swelling in the joints or loss of balance. Patient denies any skin changes, loss of hair or loss of weight. Patient denies any excessive worrying or anxiety or significant depression. Patient denies any problems with insomnia. Patient denies excessive thirst, polyuria, polydipsia. Patient denies any swollen glands, patient denies easy bruising or easy bleeding. Patient denies any recent infections, allergies or URI. Patient "s visual fields have not changed significantly in recent time.    PHYSICAL EXAM: BP 121/78   Pulse 85   Temp 98.7 F (37.1 C)   Resp 18   Wt 246 lb 11.1 oz (111.9 kg)   BMI 38.64 kg/m  On rectal exam rectal sphincter tone is good prostate is firm  with no discrete nodularity noted sulcus is preserved bilaterally. Rectal exam is otherwise unremarkable. Well-developed well-nourished patient in NAD. HEENT reveals PERLA, EOMI, discs not visualized.  Oral cavity is clear. No oral mucosal lesions are identified. Neck is clear without evidence of cervical or supraclavicular  adenopathy. Lungs are clear to A&P. Cardiac examination is essentially unremarkable with regular rate and rhythm without murmur rub or thrill. Abdomen is benign with no organomegaly or masses noted. Motor sensory and DTR levels are equal and symmetric in the upper and lower extremities. Cranial nerves II through XII are grossly intact. Proprioception is intact. No peripheral adenopathy or edema is identified. No motor or sensory levels are noted. Crude visual fields are within normal range.  LABORATORY DATA: Pathology reports reviewed    RADIOLOGY RESULTS: Bone scan reviewed showing no evidence of metastatic disease   IMPRESSION: Stage II large-volume adenocarcinoma of the prostate in 51 year old male  PLAN: At this time I run the [facility] nomogram based on the patient's parameters which showed only a 20% chance of organ confined disease with a 78% chance of extracapsular extension. Lymph node involvement was in the 10% range. Based on these findings I feel comfortable offering tri-modal therapy using Lupron suppression for at least a year and a half to 2 years. Would also treat his prostate and pelvic lymph nodes up to 5000 cGy boosting his prostate another 100 gray using I-125 interstitial implant. Risks and benefits of all forms of this type of treatment including increased lower urinary tract symptoms fatigue skin reaction diarrhea all were discussed in detail with the patient and his wife. We also discussed the possibility of worsening of his erectile dysfunction. Patient will be weighing his decisions. I've asked to see him back next week for CT simulation. We'll also arrange for him to have his first Lupron injection performed.  I would like to take this opportunity to thank you for allowing me to participate in the care of your patient.Armstead Peaks., MD

## 2016-06-05 NOTE — Progress Notes (Signed)
  Oncology Nurse Navigator Documentation Met with [name] and his spouse before and during consult with Dr. Baruch Gouty. Introduced nurse navigator services and provided contact information for future questions, education, support. He works over an hour away and is concerned with getting to appointments daily from work. He will bring FMLA papers in to his simulation appointment. Navigator Location: [facility] (06/05/16 0900)   )Navigator Encounter Type: Initial RadOnc (06/05/16 0900)                     Patient Visit Type: RadOnc;Initial (06/05/16 0900) Treatment Phase: Pre-Tx/Tx Discussion (06/05/16 0900) Barriers/Navigation Needs: Education;Coordination of Care (06/05/16 0900)                    Acuity Level 2: Initial guidance, education and coordination as needed;Educational needs;Ongoing guidance and education throughout treatment as needed (06/05/16 0900)     Time Spent with Patient: 45 (06/05/16 0900)

## 2016-06-08 ENCOUNTER — Ambulatory Visit: Payer: Managed Care, Other (non HMO)

## 2016-06-08 ENCOUNTER — Ambulatory Visit (INDEPENDENT_AMBULATORY_CARE_PROVIDER_SITE_OTHER): Payer: Managed Care, Other (non HMO) | Admitting: Urology

## 2016-06-08 VITALS — Ht 67.0 in | Wt 249.0 lb

## 2016-06-08 DIAGNOSIS — N5201 Erectile dysfunction due to arterial insufficiency: Secondary | ICD-10-CM

## 2016-06-08 DIAGNOSIS — R972 Elevated prostate specific antigen [PSA]: Secondary | ICD-10-CM

## 2016-06-08 DIAGNOSIS — C61 Malignant neoplasm of prostate: Secondary | ICD-10-CM | POA: Diagnosis not present

## 2016-06-08 NOTE — Progress Notes (Signed)
06/08/2016 3:33 PM   [name] [birth year] [id number]  Referring provider: Valera Castle, MD [address] [place], [place] [address]  Chief Complaint  Patient presents with  . Follow-up    prostate cancer    HPI:  1. 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 Bilateral apical, lateral and base positivity. Had rad-onc opinion from Dr. Baruch Gouty who outlined aggressive path with ADT + XRT + brachy boost should he want radiation.   2 - Erectile Dysfunction - on sildenafil prn for trouble maintaining erection. Libido preserved.  Today Bradley Shaw is seen in f/u above and rediscuss  Prostate cancer. He had meaningful visit with Dr. Baruch Gouty in interval. He is interested in possible radiotherapy but the logistics of XRT have him concerned. He is most interested in Astronomer v. Hospital doctor (+/- ADT).   PMH: No past medical history on file.  Surgical History: Past Surgical History:  Procedure Laterality Date  . HIP SURGERY Right 2015    Home Medications:  Allergies as of 06/08/2016      Reactions   Shellfish Allergy Anaphylaxis   Hives & throat closes      Medication List       Accurate as of 06/08/16  3:33 PM. Always use your most recent med list.          meloxicam 15 MG tablet Commonly known as:  MOBIC   sildenafil 20 MG tablet Commonly known as:  REVATIO May use 1-5 tabs daily 1/2 hr before intercourse       Allergies:  Allergies  Allergen Reactions  . Shellfish Allergy Anaphylaxis    Hives & throat closes    Family History: Family History  Problem Relation Age of Onset  . Prostate cancer Neg Hx     Social History:  reports that he has been smoking.  He has never used smokeless tobacco. He reports that he drinks alcohol. He reports that he does not use drugs.  Review of Systems  Gastrointestinal  (upper)  : Negative for upper GI symptoms  Gastrointestinal (lower) : Negative for lower GI symptoms  Constitutional : Negative for symptoms  Skin: Negative for skin symptoms  Eyes: Negative for eye symptoms  Ear/Nose/Throat : Negative for Ear/Nose/Throat symptoms  Hematologic/Lymphatic: Negative for Hematologic/Lymphatic symptoms  Cardiovascular : Negative for cardiovascular symptoms  Respiratory : Negative for respiratory symptoms  Endocrine: Negative for endocrine symptoms  Musculoskeletal: Negative for musculoskeletal symptoms  Neurological: Negative for neurological symptoms  Psychologic: Negative for psychiatric symptoms   Physical Exam: There were no vitals taken for this visit.  Constitutional:  Alert and oriented, No acute distress. His wife is with him today.  HEENT:  AT, moist mucus membranes.  Trachea midline, no masses. Cardiovascular: No clubbing, cyanosis, or edema. Respiratory: Normal respiratory effort, no increased work of breathing. GI: Abdomen is soft, nontender, nondistended, no abdominal masses GU: No CVA tenderness.  Skin: No rashes, bruises or suspicious lesions. Lymph: No cervical or inguinal adenopathy. Neurologic: Grossly intact, no focal deficits, moving all 4 extremities. Psychiatric: Normal mood and affect.  Laboratory Data: Lab Results  Component Value Date   WBC 4.6 03/24/2014   HGB 11.6 (L) 04/02/2014   HCT 40.7 03/24/2014   MCV 91 03/24/2014   PLT 185 04/02/2014    Lab Results  Component Value Date   CREATININE  0.99 04/02/2014    No results found for: PSA  No results found for: TESTOSTERONE  No results found for: HGBA1C  Urinalysis    Component Value Date/Time   COLORURINE Yellow 03/24/2014 0904   APPEARANCEUR Clear 01/11/2016 1331   LABSPEC 1.012 03/24/2014 0904   PHURINE 5.0 03/24/2014 0904   GLUCOSEU Negative 01/11/2016 1331   GLUCOSEU Negative 03/24/2014 0904   HGBUR Negative 03/24/2014 0904    BILIRUBINUR Negative 01/11/2016 1331   BILIRUBINUR Negative 03/24/2014 0904   KETONESUR Negative 03/24/2014 0904   PROTEINUR Negative 01/11/2016 1331   PROTEINUR Negative 03/24/2014 0904   NITRITE Negative 01/11/2016 1331   NITRITE Negative 03/24/2014 0904   LEUKOCYTESUR Negative 01/11/2016 1331   LEUKOCYTESUR Negative 03/24/2014 0904    Pertinent Imaging: Bone scan - no mets  Assessment & Plan:     1. 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 Given volume of disease, should he want surgery, he would need wide, non nerve-sparing approach and aggressive bilateral node dissection.  At this point he would like to discuss radiation options again, with second opinion. I told him I thought Dr. Olena Leatherwood plan, although aggressive, makes good sense given his volume of disease, but that second opinions are welcome by any of [place].   2 - Erectile Dysfunction - frankly discussed this would worsen with ANY prostate cancer treatment.   RTC 4-6 weeks to rediscuss with rad-onc 2nd opinion in interval.    Alexis Frock, [facility] [address] [place], [place] [address] [phone]

## 2016-06-11 ENCOUNTER — Encounter: Payer: Self-pay | Admitting: Radiation Oncology

## 2016-06-12 ENCOUNTER — Ambulatory Visit
Admission: RE | Admit: 2016-06-12 | Discharge: 2016-06-12 | Disposition: A | Discharge status: Home / Self Care | Payer: Managed Care, Other (non HMO) | Source: Ambulatory Visit | Attending: Radiation Oncology | Admitting: Radiation Oncology

## 2016-06-12 ENCOUNTER — Other Ambulatory Visit: Payer: Self-pay | Admitting: *Deleted

## 2016-06-12 ENCOUNTER — Inpatient Hospital Stay: Payer: Managed Care, Other (non HMO) | Attending: Radiation Oncology

## 2016-06-12 DIAGNOSIS — C61 Malignant neoplasm of prostate: Secondary | ICD-10-CM | POA: Diagnosis present

## 2016-06-12 DIAGNOSIS — Z79818 Long term (current) use of other agents affecting estrogen receptors and estrogen levels: Secondary | ICD-10-CM | POA: Insufficient documentation

## 2016-06-12 DIAGNOSIS — Z510 Encounter for antineoplastic radiation therapy: Secondary | ICD-10-CM | POA: Diagnosis not present

## 2016-06-12 MED ORDER — LEUPROLIDE ACETATE (4 MONTH) 30 MG IM KIT
30.0000 mg | PACK | Freq: Once | INTRAMUSCULAR | Status: AC
  Administered 2016-06-12: 30 mg via INTRAMUSCULAR
  Filled 2016-06-12: qty 30

## 2016-06-13 ENCOUNTER — Other Ambulatory Visit: Payer: Self-pay | Admitting: Radiology

## 2016-06-13 DIAGNOSIS — C61 Malignant neoplasm of prostate: Secondary | ICD-10-CM

## 2016-06-14 DIAGNOSIS — C61 Malignant neoplasm of prostate: Secondary | ICD-10-CM | POA: Diagnosis not present

## 2016-06-15 ENCOUNTER — Other Ambulatory Visit: Payer: Self-pay | Admitting: *Deleted

## 2016-06-15 DIAGNOSIS — C61 Malignant neoplasm of prostate: Secondary | ICD-10-CM

## 2016-06-18 ENCOUNTER — Encounter: Payer: Self-pay | Admitting: Radiation Oncology

## 2016-06-18 ENCOUNTER — Ambulatory Visit: Payer: Managed Care, Other (non HMO) | Admitting: Radiation Oncology

## 2016-06-18 ENCOUNTER — Telehealth: Payer: Self-pay | Admitting: Radiation Oncology

## 2016-06-18 ENCOUNTER — Ambulatory Visit: Payer: Managed Care, Other (non HMO)

## 2016-06-18 ENCOUNTER — Ambulatory Visit
Admission: RE | Admit: 2016-06-18 | Discharge: 2016-06-18 | Disposition: A | Discharge status: Home / Self Care | Payer: Managed Care, Other (non HMO) | Source: Ambulatory Visit | Attending: Radiation Oncology | Admitting: Radiation Oncology

## 2016-06-18 HISTORY — DX: Malignant neoplasm of prostate: C61

## 2016-06-18 NOTE — Progress Notes (Signed)
GU Location of Tumor / Histology: prostatic adenocarcinoma  If Prostate Cancer, Gleason Score is (3 + 4) and PSA is (13.6). Prostate volume: 34 cc.    Biopsies of prostate (if applicable) revealed:    Past/Anticipated interventions by urology, if any: biopsy, referral to Dr. Baruch Gouty, repeat discussion of treatment option, referral for second opinion to Dr. Tammi Klippel  Past/Anticipated interventions by medical oncology, if any: no  Weight changes, if any: no  Bowel/Bladder complaints, if any: ED.   Nausea/Vomiting, if any: no  Pain issues, if any:    SAFETY ISSUES:  Prior radiation?   Pacemaker/ICD?   Possible current pregnancy? no  Is the patient on methotrexate?   Current Complaints / other details:  51 year old male. Married.   Dr. Baruch Gouty outlined aggressive path with ADT + XRT + brachy boost  Lupron?

## 2016-06-18 NOTE — Telephone Encounter (Signed)
Patient had not shown for 0730 nurse evaluation. Phoned patient to inquire. No answer. Left message requesting return call. Patient phoned back shortly thereafter. He reports he cancelled this appointment on Thursday, March 1. Patient uncertain of the name of the individual he spoke with. Patient confirms he is moving forward with radiation under the care of Dr. Baruch Gouty.

## 2016-06-19 ENCOUNTER — Encounter: Payer: Self-pay | Admitting: Medical Oncology

## 2016-06-21 ENCOUNTER — Ambulatory Visit
Admission: RE | Admit: 2016-06-21 | Discharge: 2016-06-21 | Disposition: A | Discharge status: Home / Self Care | Payer: Managed Care, Other (non HMO) | Source: Ambulatory Visit | Attending: Radiation Oncology | Admitting: Radiation Oncology

## 2016-06-25 ENCOUNTER — Ambulatory Visit
Admission: RE | Admit: 2016-06-25 | Discharge: 2016-06-25 | Disposition: A | Discharge status: Home / Self Care | Payer: Managed Care, Other (non HMO) | Source: Ambulatory Visit | Attending: Radiation Oncology | Admitting: Radiation Oncology

## 2016-06-25 DIAGNOSIS — C61 Malignant neoplasm of prostate: Secondary | ICD-10-CM | POA: Diagnosis not present

## 2016-06-26 ENCOUNTER — Telehealth: Payer: Self-pay | Admitting: Radiology

## 2016-06-26 ENCOUNTER — Ambulatory Visit
Admission: RE | Admit: 2016-06-26 | Discharge: 2016-06-26 | Disposition: A | Discharge status: Home / Self Care | Payer: Managed Care, Other (non HMO) | Source: Ambulatory Visit | Attending: Radiation Oncology | Admitting: Radiation Oncology

## 2016-06-26 DIAGNOSIS — C61 Malignant neoplasm of prostate: Secondary | ICD-10-CM | POA: Diagnosis not present

## 2016-06-26 NOTE — Telephone Encounter (Signed)
Confirmed with pt, dates & instructions regarding prostate volume study & seed implants. Answered pt's questions. Pt voices understanding.

## 2016-06-26 NOTE — Telephone Encounter (Signed)
LMOM. Need to discuss prostate seed implants.

## 2016-06-27 ENCOUNTER — Ambulatory Visit
Admission: RE | Admit: 2016-06-27 | Discharge: 2016-06-27 | Disposition: A | Discharge status: Home / Self Care | Payer: Managed Care, Other (non HMO) | Source: Ambulatory Visit | Attending: Radiation Oncology | Admitting: Radiation Oncology

## 2016-06-27 DIAGNOSIS — C61 Malignant neoplasm of prostate: Secondary | ICD-10-CM | POA: Diagnosis not present

## 2016-06-28 ENCOUNTER — Ambulatory Visit
Admission: RE | Admit: 2016-06-28 | Discharge: 2016-06-28 | Disposition: A | Discharge status: Home / Self Care | Payer: Managed Care, Other (non HMO) | Source: Ambulatory Visit | Attending: Radiation Oncology | Admitting: Radiation Oncology

## 2016-06-28 DIAGNOSIS — C61 Malignant neoplasm of prostate: Secondary | ICD-10-CM | POA: Diagnosis not present

## 2016-06-29 ENCOUNTER — Ambulatory Visit
Admission: RE | Admit: 2016-06-29 | Discharge: 2016-06-29 | Disposition: A | Discharge status: Home / Self Care | Payer: Managed Care, Other (non HMO) | Source: Ambulatory Visit | Attending: Radiation Oncology | Admitting: Radiation Oncology

## 2016-06-29 DIAGNOSIS — C61 Malignant neoplasm of prostate: Secondary | ICD-10-CM | POA: Diagnosis not present

## 2016-07-02 ENCOUNTER — Ambulatory Visit
Admission: RE | Admit: 2016-07-02 | Discharge: 2016-07-02 | Disposition: A | Discharge status: Home / Self Care | Payer: Managed Care, Other (non HMO) | Source: Ambulatory Visit | Attending: Radiation Oncology | Admitting: Radiation Oncology

## 2016-07-02 DIAGNOSIS — C61 Malignant neoplasm of prostate: Secondary | ICD-10-CM | POA: Diagnosis not present

## 2016-07-03 ENCOUNTER — Ambulatory Visit
Admission: RE | Admit: 2016-07-03 | Discharge: 2016-07-03 | Disposition: A | Discharge status: Home / Self Care | Payer: Managed Care, Other (non HMO) | Source: Ambulatory Visit | Attending: Radiation Oncology | Admitting: Radiation Oncology

## 2016-07-03 DIAGNOSIS — C61 Malignant neoplasm of prostate: Secondary | ICD-10-CM | POA: Diagnosis not present

## 2016-07-03 NOTE — Progress Notes (Signed)
Bradley Shaw cancelled his appointment here at [facility] and is under treatment at [facility].

## 2016-07-04 ENCOUNTER — Ambulatory Visit
Admission: RE | Admit: 2016-07-04 | Discharge: 2016-07-04 | Disposition: A | Discharge status: Home / Self Care | Payer: Managed Care, Other (non HMO) | Source: Ambulatory Visit | Attending: Radiation Oncology | Admitting: Radiation Oncology

## 2016-07-04 DIAGNOSIS — C61 Malignant neoplasm of prostate: Secondary | ICD-10-CM | POA: Diagnosis not present

## 2016-07-05 ENCOUNTER — Inpatient Hospital Stay: Payer: Managed Care, Other (non HMO) | Attending: Radiation Oncology

## 2016-07-05 ENCOUNTER — Ambulatory Visit
Admission: RE | Admit: 2016-07-05 | Discharge: 2016-07-05 | Disposition: A | Discharge status: Home / Self Care | Payer: Managed Care, Other (non HMO) | Source: Ambulatory Visit | Attending: Radiation Oncology | Admitting: Radiation Oncology

## 2016-07-05 DIAGNOSIS — C61 Malignant neoplasm of prostate: Secondary | ICD-10-CM | POA: Diagnosis not present

## 2016-07-05 LAB — CBC
HCT: 40.8 % (ref 40.0–52.0)
Hemoglobin: 14.2 g/dL (ref 13.0–18.0)
MCH: 31 pg (ref 26.0–34.0)
MCHC: 34.7 g/dL (ref 32.0–36.0)
MCV: 89.3 fL (ref 80.0–100.0)
Platelets: 182 10*3/uL (ref 150–440)
RBC: 4.57 MIL/uL (ref 4.40–5.90)
RDW: 14.8 % — ABNORMAL HIGH (ref 11.5–14.5)
WBC: 3.8 10*3/uL (ref 3.8–10.6)

## 2016-07-06 ENCOUNTER — Ambulatory Visit: Admission: RE | Admit: 2016-07-06 | Payer: Managed Care, Other (non HMO) | Source: Ambulatory Visit

## 2016-07-06 ENCOUNTER — Ambulatory Visit
Admission: RE | Admit: 2016-07-06 | Discharge: 2016-07-06 | Disposition: A | Discharge status: Home / Self Care | Payer: Managed Care, Other (non HMO) | Source: Ambulatory Visit | Attending: Radiation Oncology | Admitting: Radiation Oncology

## 2016-07-06 DIAGNOSIS — C61 Malignant neoplasm of prostate: Secondary | ICD-10-CM | POA: Diagnosis not present

## 2016-07-09 ENCOUNTER — Ambulatory Visit: Payer: Managed Care, Other (non HMO)

## 2016-07-10 ENCOUNTER — Other Ambulatory Visit: Payer: Self-pay | Admitting: *Deleted

## 2016-07-10 ENCOUNTER — Ambulatory Visit
Admission: RE | Admit: 2016-07-10 | Discharge: 2016-07-10 | Disposition: A | Discharge status: Home / Self Care | Payer: Managed Care, Other (non HMO) | Source: Ambulatory Visit | Attending: Radiation Oncology | Admitting: Radiation Oncology

## 2016-07-10 DIAGNOSIS — C61 Malignant neoplasm of prostate: Secondary | ICD-10-CM | POA: Diagnosis not present

## 2016-07-10 MED ORDER — TAMSULOSIN HCL 0.4 MG PO CAPS: 0.4000 mg | ORAL_CAPSULE | Freq: Every day | ORAL | 6 refills | Status: DC

## 2016-07-11 ENCOUNTER — Ambulatory Visit
Admission: RE | Admit: 2016-07-11 | Discharge: 2016-07-11 | Disposition: A | Discharge status: Home / Self Care | Payer: Managed Care, Other (non HMO) | Source: Ambulatory Visit | Attending: Radiation Oncology | Admitting: Radiation Oncology

## 2016-07-11 DIAGNOSIS — C61 Malignant neoplasm of prostate: Secondary | ICD-10-CM | POA: Diagnosis not present

## 2016-07-12 ENCOUNTER — Ambulatory Visit
Admission: RE | Admit: 2016-07-12 | Discharge: 2016-07-12 | Disposition: A | Discharge status: Home / Self Care | Payer: Managed Care, Other (non HMO) | Source: Ambulatory Visit | Attending: Radiation Oncology | Admitting: Radiation Oncology

## 2016-07-12 DIAGNOSIS — C61 Malignant neoplasm of prostate: Secondary | ICD-10-CM | POA: Diagnosis not present

## 2016-07-13 ENCOUNTER — Ambulatory Visit
Admission: RE | Admit: 2016-07-13 | Discharge: 2016-07-13 | Disposition: A | Discharge status: Home / Self Care | Payer: Managed Care, Other (non HMO) | Source: Ambulatory Visit | Attending: Radiation Oncology | Admitting: Radiation Oncology

## 2016-07-13 DIAGNOSIS — C61 Malignant neoplasm of prostate: Secondary | ICD-10-CM | POA: Diagnosis not present

## 2016-07-16 ENCOUNTER — Ambulatory Visit
Admission: RE | Admit: 2016-07-16 | Discharge: 2016-07-16 | Disposition: A | Discharge status: Home / Self Care | Payer: Managed Care, Other (non HMO) | Source: Ambulatory Visit | Attending: Radiation Oncology | Admitting: Radiation Oncology

## 2016-07-16 DIAGNOSIS — C61 Malignant neoplasm of prostate: Secondary | ICD-10-CM | POA: Diagnosis not present

## 2016-07-17 ENCOUNTER — Ambulatory Visit
Admission: RE | Admit: 2016-07-17 | Discharge: 2016-07-17 | Disposition: A | Discharge status: Home / Self Care | Payer: Managed Care, Other (non HMO) | Source: Ambulatory Visit | Attending: Radiation Oncology | Admitting: Radiation Oncology

## 2016-07-17 DIAGNOSIS — C61 Malignant neoplasm of prostate: Secondary | ICD-10-CM | POA: Diagnosis not present

## 2016-07-18 ENCOUNTER — Ambulatory Visit
Admission: RE | Admit: 2016-07-18 | Discharge: 2016-07-18 | Disposition: A | Discharge status: Home / Self Care | Payer: Managed Care, Other (non HMO) | Source: Ambulatory Visit | Attending: Radiation Oncology | Admitting: Radiation Oncology

## 2016-07-18 DIAGNOSIS — C61 Malignant neoplasm of prostate: Secondary | ICD-10-CM | POA: Diagnosis not present

## 2016-07-19 ENCOUNTER — Ambulatory Visit
Admission: RE | Admit: 2016-07-19 | Discharge: 2016-07-19 | Disposition: A | Discharge status: Home / Self Care | Payer: Managed Care, Other (non HMO) | Source: Ambulatory Visit | Attending: Radiation Oncology | Admitting: Radiation Oncology

## 2016-07-19 ENCOUNTER — Inpatient Hospital Stay: Payer: Managed Care, Other (non HMO) | Attending: Radiation Oncology

## 2016-07-19 DIAGNOSIS — C61 Malignant neoplasm of prostate: Secondary | ICD-10-CM | POA: Insufficient documentation

## 2016-07-19 LAB — CBC
HCT: 39 % — ABNORMAL LOW (ref 40.0–52.0)
HEMOGLOBIN: 13.7 g/dL (ref 13.0–18.0)
MCH: 31.2 pg (ref 26.0–34.0)
MCHC: 35.1 g/dL (ref 32.0–36.0)
MCV: 89 fL (ref 80.0–100.0)
Platelets: 175 10*3/uL (ref 150–440)
RBC: 4.38 MIL/uL — AB (ref 4.40–5.90)
RDW: 14.6 % — ABNORMAL HIGH (ref 11.5–14.5)
WBC: 4.5 10*3/uL (ref 3.8–10.6)

## 2016-07-20 ENCOUNTER — Ambulatory Visit
Admission: RE | Admit: 2016-07-20 | Discharge: 2016-07-20 | Disposition: A | Discharge status: Home / Self Care | Payer: Managed Care, Other (non HMO) | Source: Ambulatory Visit | Attending: Radiation Oncology | Admitting: Radiation Oncology

## 2016-07-20 DIAGNOSIS — C61 Malignant neoplasm of prostate: Secondary | ICD-10-CM | POA: Diagnosis not present

## 2016-07-23 ENCOUNTER — Ambulatory Visit
Admission: RE | Admit: 2016-07-23 | Discharge: 2016-07-23 | Disposition: A | Discharge status: Home / Self Care | Payer: Managed Care, Other (non HMO) | Source: Ambulatory Visit | Attending: Radiation Oncology | Admitting: Radiation Oncology

## 2016-07-23 ENCOUNTER — Encounter: Payer: Self-pay | Admitting: *Deleted

## 2016-07-23 DIAGNOSIS — C61 Malignant neoplasm of prostate: Secondary | ICD-10-CM | POA: Diagnosis not present

## 2016-07-24 ENCOUNTER — Ambulatory Visit
Admission: RE | Admit: 2016-07-24 | Discharge: 2016-07-24 | Disposition: A | Discharge status: Home / Self Care | Payer: Managed Care, Other (non HMO) | Source: Ambulatory Visit | Attending: Radiation Oncology | Admitting: Radiation Oncology

## 2016-07-24 ENCOUNTER — Encounter: Payer: Self-pay | Admitting: Radiation Oncology

## 2016-07-24 ENCOUNTER — Ambulatory Visit: Payer: Managed Care, Other (non HMO)

## 2016-07-24 VITALS — BP 136/82 | HR 79 | Temp 97.3°F | Resp 20 | Wt 248.7 lb

## 2016-07-24 DIAGNOSIS — C61 Malignant neoplasm of prostate: Secondary | ICD-10-CM

## 2016-07-24 SURGERY — ULTRASOUND, PROSTATE, FOR VOLUME DETERMINATION
Anesthesia: Choice

## 2016-07-24 NOTE — Progress Notes (Signed)
Radiation Oncology Follow up Note  Name: Bradley Shaw   Date:   06/12/2016 MRN:  [id number] DOB: 1965-12-01    This 51 y.o. male presents to the clinic today for volume study.  REFERRING PROVIDER: No ref. provider found  HPI:  Patient is a 51 year old male initially presented with a PSA of 13.6 underwent transrectal ultrasound-guided biopsy positive for 8 of 12 cores for Gleason 7 (3+4) adenocarcinoma. He was seen by urology and radiation oncology and it was determined to go ahead with I-125 interstitial implant as well as external beam radiation therapy. He is been undergoing external beam radiation tolerating extremely well with only some minor burning. He is seen today for both history and physical and volume study in preparation for a boost to his prostate using I-125.Marland Kitchen  COMPLICATIONS OF TREATMENT: none  FOLLOW UP COMPLIANCE: keeps appointments   PHYSICAL EXAM:  There were no vitals taken for this visit. Well-developed well-nourished patient in NAD. HEENT reveals PERLA, EOMI, discs not visualized.  Oral cavity is clear. No oral mucosal lesions are identified. Neck is clear without evidence of cervical or supraclavicular adenopathy. Lungs are clear to A&P. Cardiac examination is essentially unremarkable with regular rate and rhythm without murmur rub or thrill. Abdomen is benign with no organomegaly or masses noted. Motor sensory and DTR levels are equal and symmetric in the upper and lower extremities. Cranial nerves II through XII are grossly intact. Proprioception is intact. No peripheral adenopathy or edema is identified. No motor or sensory levels are noted. Crude visual fields are within normal range.  RADIOLOGY RESULTS: Ultrasound performed for volume study  PLAN: Patient was taken to the cystoscopy suite in the OR. Patient was placed in the low lithotomy position. Foley catheter was placed. Trans-rectal ultrasound probe was inserted into the rectum and prostate seminal vesicles  were visualized as well as bladder base. stepping images were performed on a 5 mm increments. Images will be placed in [facility] treatment planning system to determine seed placement coordinates for eventual I-125 interstitial implant. Images will be reviewed with the physics and dosimetry staff for final quality approval. I personally was present for the volume study and assisted in delineation of contour volumes.  At the end of the procedure Foley catheter was removed, rectal ultrasound probe was removed. Patient tolerated his procedures extremely well with no side effects or complaints. Patient has given appointment for interstitial implant date. Consent was signed today as well as history and physical performed in preparation for his outpatient surgical implant.      Armstead Peaks., MD

## 2016-07-24 NOTE — H&P (Signed)
NEW PATIENT EVALUATION  Name: Bradley Shaw  MRN: [id number]  Date:   07/24/2016     DOB: [birth year]   This 51 y.o. male patient presents to the clinic for volume study and history and physical in preparation for I-125 interstitial implant of the prostate   REFERRING PHYSICIAN: Valera Castle, *  CHIEF COMPLAINT:  Chief Complaint  Patient presents with  . Prostate Cancer    Pt is here for follow up post volume study    DIAGNOSIS: The encounter diagnosis was Malignant neoplasm of prostate ([place]).   PREVIOUS INVESTIGATIONS:  Pathology reports reviewed Clinical notes reviewed  Volume Study  Performed   HPI: Patient is a 51 year old male initially presented with a PSA of 13.6 underwent transrectal ultrasound-guided biopsy positive for 8 of 12 cores for Gleason 7 (3+4) adenocarcinoma. He was seen by urology and radiation oncology and it was determined to go ahead with I-125 interstitial implant as well as external beam radiation therapy. He is been undergoing external beam radiation tolerating extremely well with only some minor burning. He is seen today for both history and physical and volume study in preparation for a boost to his prostate using I-125.  PLANNED TREATMENT REGIMEN: I-125 interstitial implant for boost  PAST MEDICAL HISTORY:  has a past medical history of Prostate cancer ([place]).    PAST SURGICAL HISTORY:  Past Surgical History:  Procedure Laterality Date  . HIP SURGERY Right 2015  . PROSTATE BIOPSY      FAMILY HISTORY: family history is not on file.  SOCIAL HISTORY:  reports that he has been smoking.  He has never used smokeless tobacco. He reports that he drinks alcohol. He reports that he does not use drugs.  ALLERGIES: Shellfish allergy  MEDICATIONS:  Current Outpatient Prescriptions  Medication Sig Dispense Refill  . meloxicam (MOBIC) 15 MG tablet     . sildenafil (REVATIO) 20 MG tablet May use 1-5 tabs daily 1/2 hr before intercourse    .  tamsulosin (FLOMAX) 0.4 MG CAPS capsule Take 1 capsule (0.4 mg total) by mouth daily after supper. 30 capsule 6   No current facility-administered medications for this encounter.     ECOG PERFORMANCE STATUS:  0 - Asymptomatic  REVIEW OF SYSTEMS:  Patient denies any weight loss, fatigue, weakness, fever, chills or night sweats. Patient denies any loss of vision, blurred vision. Patient denies any ringing  of the ears or hearing loss. No irregular heartbeat. Patient denies heart murmur or history of fainting. Patient denies any chest pain or pain radiating to her upper extremities. Patient denies any shortness of breath, difficulty breathing at night, cough or hemoptysis. Patient denies any swelling in the lower legs. Patient denies any nausea vomiting, vomiting of blood, or coffee ground material in the vomitus. Patient denies any stomach pain. Patient states has had normal bowel movements no significant constipation or diarrhea. Patient denies any dysuria, hematuria or significant nocturia. Patient denies any problems walking, swelling in the joints or loss of balance. Patient denies any skin changes, loss of hair or loss of weight. Patient denies any excessive worrying or anxiety or significant depression. Patient denies any problems with insomnia. Patient denies excessive thirst, polyuria, polydipsia. Patient denies any swollen glands, patient denies easy bruising or easy bleeding. Patient denies any recent infections, allergies or URI. Patient "s visual fields have not changed significantly in recent time.    PHYSICAL EXAM: BP 136/82   Pulse 79   Temp 97.3 F (36.3 C)  Resp 20   Wt 248 lb 10.9 oz (112.8 kg)   BMI 38.95 kg/m  Well-developed well-nourished patient in NAD. HEENT reveals PERLA, EOMI, discs not visualized.  Oral cavity is clear. No oral mucosal lesions are identified. Neck is clear without evidence of cervical or supraclavicular adenopathy. Lungs are clear to A&P. Cardiac  examination is essentially unremarkable with regular rate and rhythm without murmur rub or thrill. Abdomen is benign with no organomegaly or masses noted. Motor sensory and DTR levels are equal and symmetric in the upper and lower extremities. Cranial nerves II through XII are grossly intact. Proprioception is intact. No peripheral adenopathy or edema is identified. No motor or sensory levels are noted. Crude visual fields are within normal range.  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 Risks and benefits of treatment including possible increased lower urinary tract symptoms diarrhea fatigue were explained to the patient also radiation safety precautions were again reviewed. Patient tolerated the volume study well. He is a rescheduled for his implant. Patient knows to call with any concerns prior to implant.  I would like to take this opportunity to thank you for allowing me to participate in the care of your patient.Armstead Peaks., MD

## 2016-07-25 ENCOUNTER — Ambulatory Visit: Admission: RE | Admit: 2016-07-25 | Payer: Managed Care, Other (non HMO) | Source: Ambulatory Visit | Admitting: Urology

## 2016-07-25 ENCOUNTER — Ambulatory Visit
Admission: RE | Admit: 2016-07-25 | Discharge: 2016-07-25 | Disposition: A | Discharge status: Home / Self Care | Payer: Managed Care, Other (non HMO) | Source: Ambulatory Visit | Attending: Radiation Oncology | Admitting: Radiation Oncology

## 2016-07-25 DIAGNOSIS — C61 Malignant neoplasm of prostate: Secondary | ICD-10-CM | POA: Diagnosis not present

## 2016-07-26 ENCOUNTER — Ambulatory Visit
Admission: RE | Admit: 2016-07-26 | Discharge: 2016-07-26 | Disposition: A | Discharge status: Home / Self Care | Payer: Managed Care, Other (non HMO) | Source: Ambulatory Visit | Attending: Radiation Oncology | Admitting: Radiation Oncology

## 2016-07-26 DIAGNOSIS — C61 Malignant neoplasm of prostate: Secondary | ICD-10-CM | POA: Diagnosis not present

## 2016-07-27 ENCOUNTER — Ambulatory Visit
Admission: RE | Admit: 2016-07-27 | Discharge: 2016-07-27 | Disposition: A | Discharge status: Home / Self Care | Payer: Managed Care, Other (non HMO) | Source: Ambulatory Visit | Attending: Radiation Oncology | Admitting: Radiation Oncology

## 2016-07-27 DIAGNOSIS — C61 Malignant neoplasm of prostate: Secondary | ICD-10-CM | POA: Diagnosis not present

## 2016-07-30 ENCOUNTER — Encounter: Payer: Self-pay | Admitting: *Deleted

## 2016-07-30 ENCOUNTER — Ambulatory Visit
Admission: RE | Admit: 2016-07-30 | Discharge: 2016-07-30 | Disposition: A | Discharge status: Home / Self Care | Payer: Managed Care, Other (non HMO) | Source: Ambulatory Visit | Attending: Radiation Oncology | Admitting: Radiation Oncology

## 2016-07-30 ENCOUNTER — Ambulatory Visit: Payer: Managed Care, Other (non HMO)

## 2016-07-30 DIAGNOSIS — C61 Malignant neoplasm of prostate: Secondary | ICD-10-CM | POA: Diagnosis not present

## 2016-07-31 ENCOUNTER — Ambulatory Visit
Admission: RE | Admit: 2016-07-31 | Discharge: 2016-07-31 | Disposition: A | Discharge status: Home / Self Care | Payer: Managed Care, Other (non HMO) | Source: Ambulatory Visit | Attending: Radiation Oncology | Admitting: Radiation Oncology

## 2016-08-10 ENCOUNTER — Encounter
Admission: RE | Admit: 2016-08-10 | Discharge: 2016-08-10 | Disposition: A | Discharge status: Home / Self Care | Payer: Managed Care, Other (non HMO) | Source: Ambulatory Visit | Attending: Urology | Admitting: Urology

## 2016-08-10 DIAGNOSIS — Z01812 Encounter for preprocedural laboratory examination: Secondary | ICD-10-CM | POA: Insufficient documentation

## 2016-08-10 NOTE — Patient Instructions (Signed)
  Your procedure is scheduled on: Mon.08/20/16 Report to Day Surgery. To find out your arrival time please call [phone] between 1PM - 3PM on Fri.08/17/16  Remember: Instructions that are not followed completely may result in serious medical risk, up to and including death, or upon the discretion of your surgeon and anesthesiologist your surgery may need to be rescheduled.    __x__ 1. Do not eat food or drink liquids after midnight. No gum chewing or hard candies.     ___x_ 2. No Alcohol for 24 hours before or after surgery.   ___x_ 3. Do Not Smoke For 24 Hours Prior to Your Surgery.   ____ 4. Bring all medications with you on the day of surgery if instructed.    __x__ 5. Notify your doctor if there is any change in your medical condition     (cold, fever, infections).       Do not wear jewelry, make-up, hairpins, clips or nail polish.  Do not wear lotions, powders, or perfumes. You may wear deodorant.  Do not shave 48 hours prior to surgery. Men may shave face and neck.  Do not bring valuables to the hospital.    [facility] is not responsible for any belongings or valuables.               Contacts, dentures or bridgework may not be worn into surgery.  Leave your suitcase in the car. After surgery it may be brought to your room.  For patients admitted to the hospital, discharge time is determined by your                treatment team.   Patients discharged the day of surgery will not be allowed to drive home.   Please read over the following fact sheets that you were given:      ____ Take these medicines the morning of surgery with A SIP OF WATER:    1.  2.   3.   4.  5.  6.  __x__ Fleet Enema (as directed) on AM of surgery/ clear liq for 2 days prior to surgery  _x__ shower night before and am of surgery  ____ Use inhalers on the day of surgery  ____ Stop metformin 2 days prior to surgery    ____ Take 1/2 of usual insulin dose the night before surgery and none on  the morning of surgery.   ____ Stop Coumadin/Plavix/aspirin on   _x___ Stop Anti-inflammatories on 5/1 ibuprofen (ADVIL,MOTRIN) 200 MG tablet, Aspirin , BC powder,aspercreme   ____ Stop supplements until after surgery.    ____ Bring C-Pap to the hospital.

## 2016-08-17 MED ORDER — FAMOTIDINE 20 MG PO TABS
ORAL_TABLET | ORAL | Status: AC
  Filled 2016-08-17: qty 1

## 2016-08-19 MED ORDER — CEFAZOLIN SODIUM-DEXTROSE 1-4 GM/50ML-% IV SOLN
1.0000 g | INTRAVENOUS | Status: AC
  Administered 2016-08-20: 1 g via INTRAVENOUS

## 2016-08-20 ENCOUNTER — Ambulatory Visit
Admission: RE | Admit: 2016-08-20 | Discharge: 2016-08-20 | Disposition: A | Discharge status: Home / Self Care | Payer: Managed Care, Other (non HMO) | Source: Ambulatory Visit | Attending: Radiation Oncology | Admitting: Radiation Oncology

## 2016-08-20 ENCOUNTER — Ambulatory Visit: Payer: Managed Care, Other (non HMO) | Admitting: Anesthesiology

## 2016-08-20 ENCOUNTER — Encounter
Admission: RE | Disposition: A | Discharge status: Home / Self Care | Payer: Self-pay | Source: Ambulatory Visit | Attending: Urology

## 2016-08-20 ENCOUNTER — Ambulatory Visit
Admission: RE | Admit: 2016-08-20 | Discharge: 2016-08-20 | Disposition: A | Discharge status: Home / Self Care | Payer: Managed Care, Other (non HMO) | Source: Ambulatory Visit | Attending: Urology | Admitting: Urology

## 2016-08-20 DIAGNOSIS — Z79899 Other long term (current) drug therapy: Secondary | ICD-10-CM | POA: Insufficient documentation

## 2016-08-20 DIAGNOSIS — C61 Malignant neoplasm of prostate: Secondary | ICD-10-CM | POA: Insufficient documentation

## 2016-08-20 DIAGNOSIS — F17200 Nicotine dependence, unspecified, uncomplicated: Secondary | ICD-10-CM | POA: Insufficient documentation

## 2016-08-20 HISTORY — PX: RADIOACTIVE SEED IMPLANT: SHX5150

## 2016-08-20 LAB — URINE DRUG SCREEN, QUALITATIVE (ARMC ONLY)
Amphetamines, Ur Screen: NOT DETECTED
BARBITURATES, UR SCREEN: NOT DETECTED
BENZODIAZEPINE, UR SCRN: NOT DETECTED
Cannabinoid 50 Ng, Ur ~~LOC~~: POSITIVE — AB
Cocaine Metabolite,Ur ~~LOC~~: NOT DETECTED
MDMA (Ecstasy)Ur Screen: NOT DETECTED
METHADONE SCREEN, URINE: NOT DETECTED
OPIATE, UR SCREEN: NOT DETECTED
Phencyclidine (PCP) Ur S: NOT DETECTED
TRICYCLIC, UR SCREEN: NOT DETECTED

## 2016-08-20 SURGERY — INSERTION, RADIATION SOURCE, PROSTATE
Anesthesia: General | Site: Perineum | Wound class: Clean

## 2016-08-20 MED ORDER — BACITRACIN ZINC 500 UNIT/GM EX OINT
TOPICAL_OINTMENT | CUTANEOUS | Status: AC
  Filled 2016-08-20: qty 0.9

## 2016-08-20 MED ORDER — GLYCOPYRROLATE 0.2 MG/ML IJ SOLN
INTRAMUSCULAR | Status: DC | PRN
  Administered 2016-08-20: 0.2 mg via INTRAVENOUS

## 2016-08-20 MED ORDER — PROPOFOL 10 MG/ML IV BOLUS
INTRAVENOUS | Status: DC | PRN
  Administered 2016-08-20: 150 mg via INTRAVENOUS

## 2016-08-20 MED ORDER — LIDOCAINE HCL (CARDIAC) 20 MG/ML IV SOLN
INTRAVENOUS | Status: DC | PRN
  Administered 2016-08-20: 100 mg via INTRAVENOUS

## 2016-08-20 MED ORDER — DEXAMETHASONE SODIUM PHOSPHATE 10 MG/ML IJ SOLN
INTRAMUSCULAR | Status: DC | PRN
  Administered 2016-08-20: 10 mg via INTRAVENOUS

## 2016-08-20 MED ORDER — CEFAZOLIN SODIUM-DEXTROSE 1-4 GM/50ML-% IV SOLN
INTRAVENOUS | Status: AC
  Administered 2016-08-20: 1 g via INTRAVENOUS
  Filled 2016-08-20: qty 50

## 2016-08-20 MED ORDER — ONDANSETRON HCL 4 MG/2ML IJ SOLN: 4.0000 mg | Freq: Once | INTRAMUSCULAR | Status: DC | PRN

## 2016-08-20 MED ORDER — BACITRACIN 500 UNIT/GM EX OINT
TOPICAL_OINTMENT | CUTANEOUS | Status: DC | PRN
  Administered 2016-08-20: 1 via TOPICAL

## 2016-08-20 MED ORDER — FLEET ENEMA 7-19 GM/118ML RE ENEM
1.0000 | ENEMA | Freq: Once | RECTAL | Status: AC
  Administered 2016-08-20: 1 via RECTAL

## 2016-08-20 MED ORDER — DOCUSATE SODIUM 100 MG PO CAPS: 100.0000 mg | ORAL_CAPSULE | Freq: Two times a day (BID) | ORAL | 0 refills | Status: DC

## 2016-08-20 MED ORDER — CIPROFLOXACIN HCL 500 MG PO TABS: 500.0000 mg | ORAL_TABLET | Freq: Two times a day (BID) | ORAL | 0 refills | Status: DC

## 2016-08-20 MED ORDER — LACTATED RINGERS IV SOLN
INTRAVENOUS | Status: DC
  Administered 2016-08-20: 06:00:00 via INTRAVENOUS

## 2016-08-20 MED ORDER — MIDAZOLAM HCL 2 MG/2ML IJ SOLN
INTRAMUSCULAR | Status: DC | PRN
  Administered 2016-08-20: 2 mg via INTRAVENOUS

## 2016-08-20 MED ORDER — FAMOTIDINE 20 MG PO TABS
20.0000 mg | ORAL_TABLET | Freq: Once | ORAL | Status: AC
  Administered 2016-08-20: 20 mg via ORAL

## 2016-08-20 MED ORDER — HYDROCODONE-ACETAMINOPHEN 5-325 MG PO TABS: 1.0000 | ORAL_TABLET | Freq: Four times a day (QID) | ORAL | 0 refills | Status: DC | PRN

## 2016-08-20 MED ORDER — PROPOFOL 10 MG/ML IV BOLUS
INTRAVENOUS | Status: AC
  Filled 2016-08-20: qty 20

## 2016-08-20 MED ORDER — MIDAZOLAM HCL 2 MG/2ML IJ SOLN
INTRAMUSCULAR | Status: AC
  Filled 2016-08-20: qty 2

## 2016-08-20 MED ORDER — FENTANYL CITRATE (PF) 100 MCG/2ML IJ SOLN
INTRAMUSCULAR | Status: DC | PRN
  Administered 2016-08-20 (×4): 25 ug via INTRAVENOUS

## 2016-08-20 MED ORDER — FENTANYL CITRATE (PF) 100 MCG/2ML IJ SOLN
INTRAMUSCULAR | Status: AC
  Filled 2016-08-20: qty 2

## 2016-08-20 MED ORDER — FENTANYL CITRATE (PF) 100 MCG/2ML IJ SOLN
INTRAMUSCULAR | Status: AC
Start: 2016-08-20 — End: ?
  Filled 2016-08-20: qty 2

## 2016-08-20 MED ORDER — ONDANSETRON HCL 4 MG/2ML IJ SOLN
INTRAMUSCULAR | Status: DC | PRN
  Administered 2016-08-20: 4 mg via INTRAVENOUS

## 2016-08-20 MED ORDER — FENTANYL CITRATE (PF) 100 MCG/2ML IJ SOLN
25.0000 ug | INTRAMUSCULAR | Status: DC | PRN
  Administered 2016-08-20 (×4): 25 ug via INTRAVENOUS

## 2016-08-20 MED ORDER — FAMOTIDINE 20 MG PO TABS
ORAL_TABLET | ORAL | Status: AC
  Administered 2016-08-20: 20 mg via ORAL
  Filled 2016-08-20: qty 1

## 2016-08-20 SURGICAL SUPPLY — 25 items
BAG URO DRAIN 2000ML W/SPOUT (MISCELLANEOUS) ×3 IMPLANT
BLADE CLIPPER SURG (BLADE) ×3 IMPLANT
CATH FOL 2WAY LX 16X5 (CATHETERS) ×3 IMPLANT
DRAPE INCISE 23X17 IOBAN STRL (DRAPES) ×2
DRAPE INCISE IOBAN 23X17 STRL (DRAPES) ×1 IMPLANT
DRAPE SHEET LG 3/4 BI-LAMINATE (DRAPES) ×3 IMPLANT
DRAPE TABLE BACK 80X90 (DRAPES) ×3 IMPLANT
DRAPE UNDER BUTTOCK W/FLU (DRAPES) ×3 IMPLANT
DRSG TELFA 3X8 NADH (GAUZE/BANDAGES/DRESSINGS) ×3 IMPLANT
GLOVE BIO SURGEON STRL SZ 6.5 (GLOVE) ×4 IMPLANT
GLOVE BIO SURGEON STRL SZ7.5 (GLOVE) ×6 IMPLANT
GLOVE BIO SURGEONS STRL SZ 6.5 (GLOVE) ×2
GOWN STRL REUS W/ TWL LRG LVL3 (GOWN DISPOSABLE) ×2 IMPLANT
GOWN STRL REUS W/ TWL XL LVL3 (GOWN DISPOSABLE) ×1 IMPLANT
GOWN STRL REUS W/TWL LRG LVL3 (GOWN DISPOSABLE) ×4
GOWN STRL REUS W/TWL XL LVL3 (GOWN DISPOSABLE) ×2
IV NS 1000ML (IV SOLUTION) ×2
IV NS 1000ML BAXH (IV SOLUTION) ×1 IMPLANT
KIT RM TURNOVER CYSTO AR (KITS) ×3 IMPLANT
PACK CYSTO AR (MISCELLANEOUS) ×3 IMPLANT
SET CYSTO W/LG BORE CLAMP LF (SET/KITS/TRAYS/PACK) ×3 IMPLANT
SURGILUBE 2OZ TUBE FLIPTOP (MISCELLANEOUS) ×3 IMPLANT
SYRINGE 10CC LL (SYRINGE) ×3 IMPLANT
SYRINGE IRR TOOMEY STRL 70CC (SYRINGE) IMPLANT
WATER STERILE IRR 1000ML POUR (IV SOLUTION) ×3 IMPLANT

## 2016-08-20 NOTE — Anesthesia Procedure Notes (Signed)
Procedure Name: LMA Insertion Date/Time: 08/20/2016 7:45 AM Performed by: Aline Brochure Pre-anesthesia Checklist: Patient identified, Emergency Drugs available, Suction available and Patient being monitored Patient Re-evaluated:Patient Re-evaluated prior to inductionOxygen Delivery Method: Circle system utilized Preoxygenation: Pre-oxygenation with 100% oxygen Intubation Type: IV induction Ventilation: Mask ventilation without difficulty LMA: LMA inserted LMA Size: 4.5 Number of attempts: 1 Placement Confirmation: positive ETCO2 and breath sounds checked- equal and bilateral Tube secured with: Tape Dental Injury: Teeth and Oropharynx as per pre-operative assessment

## 2016-08-20 NOTE — Op Note (Signed)
08/20/16  Preoperative diagnosis: Adenocarcinoma of the prostate   Postoperative diagnosis: Same   Procedure: I-125 prostate seed implantation, cystoscopy  Surgeon: Hollice Espy M.D. ,  Radiation Oncologist: Lavena Stanford, M.D.   Anesthesia: General  Drains: mpme  Complications: none  Indications: Intermediate risk prostate cancer, see notes for details  Procedure: Patient was brought to operating suite and placement table in the supine position. At this time, a universal timeout protocol was performed, all team members were identified, Venodyne boots are placed, and he was administered IV Ancef in the preoperative period. He was placed in lithotomy position and prepped and draped in usual manner. Radiation oncology department placed a transrectal ultrasound probe anchoring stand/ grid and aligned with previous imaging from the volume study. Foley catheter was inserted without difficulty.  All needle passage was done with real-time transrectal ultrasound guidance in both the transverse and sagittal plains in order to achieve the desired preplanned position. A total of 16 needles were placed.  48 active seeds were implanted. The Foley catheter was removed and a rigid cystoscopy failed to show any seeds outside the prostate without evidence of trauma to the urethral, prostatic fossa, or bladder.  The bladder was drained.  A fluoroscopic image was then obtained showing excellent distrubution of the brachytherapy seeds.  Each seed was counted and counts were correct.    The patient was then repositioned in the supine position, reversed from anesthesia, and taken to the PACU in stable condition.  Hollice Espy, MD

## 2016-08-20 NOTE — Transfer of Care (Signed)
Immediate Anesthesia Transfer of Care Note  Patient: Bradley Shaw  Procedure(s) Performed: Procedure(s): RADIOACTIVE SEED IMPLANT/BRACHYTHERAPY IMPLANT (N/A)  Patient Location: PACU  Anesthesia Type:General  Level of Consciousness: awake, alert  and oriented  Airway & Oxygen Therapy: Patient connected to face mask oxygen  Post-op Assessment: Post -op Vital signs reviewed and stable  Post vital signs: stable  Last Vitals:  Vitals:   08/20/16 0845 08/20/16 0847  BP:  131/86  Pulse: 84 79  Resp: (!) 9 16  Temp: 36.8 C     Last Pain:  Vitals:   08/20/16 0845  TempSrc:   PainSc: 0-No pain         Complications: No apparent anesthesia complications

## 2016-08-20 NOTE — Anesthesia Post-op Follow-up Note (Cosign Needed)
Anesthesia QCDR form completed.        

## 2016-08-20 NOTE — Discharge Instructions (Signed)
Brachytherapy for Prostate Cancer, Care After Refer to this sheet in the next few weeks. These instructions provide you with information on caring for yourself after your procedure. Your health care provider may also give you more specific instructions. Your treatment has been planned according to current medical practices, but problems sometimes occur. Call your health care provider if you have any problems or questions after your procedure. What can I expect after the procedure? The area behind the scrotum will probably be tender and bruised. For a short period of time you may have:  Difficulty passing urine. You may need a catheter for a few days to a month.  Blood in the urine or semen.  A feeling of constipation because of prostate swelling.  Frequent feeling of an urgent need to urinate. For a long period of time you may have:  Inflammation of the rectum. This happens in about 2% of people who have the procedure.  Erection problems. These vary with age and occur in about 15-40% of men.  Difficulty urinating. This is caused by scarring in the urethra.  Diarrhea. Follow these instructions at home:  Take medicines only as directed by your health care provider.  You will probably have a catheter in your bladder for several days. You will have blood in the urine bag and should drink a lot of fluids to keep it a light red color.  Keep all follow-up visits as directed by your health care provider. If you have a catheter, it will be removed during one of these visits.  Try not to sit directly on the area behind the scrotum. A soft cushion can decrease the discomfort. Ice packs may also be helpful for the discomfort. Do not put ice directly on the skin.  Shower and wash the area behind the scrotum gently. Do not sit in a tub.  If you have had the brachytherapy that uses the seeds, limit your close contact with children and pregnant women for 2 months because of the radiation still in  the prostate. After that period of time, the levels drop off quickly. Get help right away if:  You have a fever.  You have chills.  You have shortness of breath.  You have chest pain.  You have thick blood, like tomato juice, in the urine bag.  Your catheter is blocked so urine cannot get into the bag. Your bladder area or lower abdomen may be swollen.  There is excessive bleeding from your rectum. It is normal to have a little blood mixed with your stool.  There is severe discomfort in the treated area that does not go away with pain medicine.  You have abdominal discomfort.  You have severe nausea or vomiting.  You develop any new or unusual symptoms. This information is not intended to replace advice given to you by your health care provider. Make sure you discuss any questions you have with your health care provider. Document Released: 05/05/2010 Document Revised: 09/14/2015 Document Reviewed: 09/23/2012 Elsevier Interactive Patient Education  2017 Index   1) The drugs that you were given will stay in your system until tomorrow so for the next 24 hours you should not:  A) Drive an automobile B) Make any legal decisions C) Drink any alcoholic beverage   2) You may resume regular meals tomorrow.  Today it is better to start with liquids and gradually work up to solid foods.  You may eat anything you prefer, but  it is better to start with liquids, then soup and crackers, and gradually work up to solid foods.   3) Please notify your doctor immediately if you have any unusual bleeding, trouble breathing, redness and pain at the surgery site, drainage, fever, or pain not relieved by medication.   4) Additional Instructions:

## 2016-08-20 NOTE — Interval H&P Note (Signed)
History and Physical Interval Note:  08/20/2016 7:19 AM  [name]  has presented today for surgery, with the diagnosis of PROSTATE CANCER  The various methods of treatment have been discussed with the patient and family. After consideration of risks, benefits and other options for treatment, the patient has consented to  Procedure(s): RADIOACTIVE SEED IMPLANT/BRACHYTHERAPY IMPLANT (N/A) as a surgical intervention .  The patient's history has been reviewed, patient examined, no change in status, stable for surgery.  I have reviewed the patient's chart and labs.  Questions were answered to the patient's satisfaction.    RRR CTAB  See previous notes for details Bradley Shaw

## 2016-08-20 NOTE — Anesthesia Postprocedure Evaluation (Signed)
Anesthesia Post Note  Patient: Bradley Shaw  Procedure(s) Performed: Procedure(s) (LRB): RADIOACTIVE SEED IMPLANT/BRACHYTHERAPY IMPLANT (N/A)  Patient location during evaluation: PACU Anesthesia Type: General Level of consciousness: awake and alert Pain management: pain level controlled Vital Signs Assessment: post-procedure vital signs reviewed and stable Respiratory status: spontaneous breathing, nonlabored ventilation, respiratory function stable and patient connected to nasal cannula oxygen Cardiovascular status: blood pressure returned to baseline and stable Postop Assessment: no signs of nausea or vomiting Anesthetic complications: no     Last Vitals:  Vitals:   08/20/16 0847 08/20/16 0900  BP: 131/86 126/89  Pulse: 79 75  Resp: 16 16  Temp:      Last Pain:  Vitals:   08/20/16 0910  TempSrc:   PainSc: [name]

## 2016-08-20 NOTE — OR Nursing (Signed)
After pt voided 100 ml pink tinged urine, bladder scaner showed 14 ml urine in bladder.

## 2016-08-20 NOTE — Progress Notes (Signed)
Radiation Oncology Follow up Note  Name: Bradley Shaw   Date:   06/12/2016 MRN:  [id number] DOB: Sep 07, 1965    This 51 y.o. male presents to the hospital today for I-125 interstitial implant to be used as a boost for his overall treatment plan for prostate cancer  REFERRING PROVIDER: No ref. provider found  HPI: Patient is a 51 year old male presented with a PSA of 13.6 biopsy of his prostate was positive for Gleason 7 (3+4) in 8 of 12 cores. He underwent external beam radiation therapy which he tolerated well and now was admitted for I-125 interstitial implant..  COMPLICATIONS OF TREATMENT: none  FOLLOW UP COMPLIANCE: keeps appointments   PHYSICAL EXAM:  BP 138/84   Pulse 71   Temp 97.3 F (36.3 C) (Temporal)   Resp 18   Ht 5\' 8"  (1.727 m)   Wt 240 lb (108.9 kg)   SpO2 100%   BMI 36.49 kg/m  Well-developed well-nourished patient in NAD. HEENT reveals PERLA, EOMI, discs not visualized.  Oral cavity is clear. No oral mucosal lesions are identified. Neck is clear without evidence of cervical or supraclavicular adenopathy. Lungs are clear to A&P. Cardiac examination is essentially unremarkable with regular rate and rhythm without murmur rub or thrill. Abdomen is benign with no organomegaly or masses noted. Motor sensory and DTR levels are equal and symmetric in the upper and lower extremities. Cranial nerves II through XII are grossly intact. Proprioception is intact. No peripheral adenopathy or edema is identified. No motor or sensory levels are noted. Crude visual fields are within normal range.  RADIOLOGY RESULTS: Ultrasound and final seed count plain film were both evaluated  PLAN: patient was taken to the operating room and general anesthesia was administered. Legs were immobilized in stirrups and patient was positioned in the exact same proportions as original volume study. Patient was prepped and Foley catheter was placed. Ultrasound guidance identified the prostate and  recreated the original set up as per treatment planning volume study. 16 needles were placed under ultrasound guidance with PVCs delivered to the prostate volume. After completion of procedure cystoscopy was performed by urology and no evidence of seeds in the bladder were noted. Patient tolerated the procedure extremely well. Initial plain film as doublecheck identified 48 seeds in the prostate. Patient has followup appointment in one month for CT scan for quality assurance will be performed.     Armstead Peaks., MD

## 2016-08-20 NOTE — H&P (View-Only) (Signed)
Radiation Oncology Follow up Note  Name: Bradley Shaw   Date:   06/12/2016 MRN:  [id number] DOB: May 22, 1965    This 51 y.o. male presents to the clinic today for volume study.  REFERRING PROVIDER: No ref. provider found  HPI:  Patient is a 51 year old male initially presented with a PSA of 13.6 underwent transrectal ultrasound-guided biopsy positive for 8 of 12 cores for Gleason 7 (3+4) adenocarcinoma. He was seen by urology and radiation oncology and it was determined to go ahead with I-125 interstitial implant as well as external beam radiation therapy. He is been undergoing external beam radiation tolerating extremely well with only some minor burning. He is seen today for both history and physical and volume study in preparation for a boost to his prostate using I-125.Marland Kitchen  COMPLICATIONS OF TREATMENT: none  FOLLOW UP COMPLIANCE: keeps appointments   PHYSICAL EXAM:  There were no vitals taken for this visit. Well-developed well-nourished patient in NAD. HEENT reveals PERLA, EOMI, discs not visualized.  Oral cavity is clear. No oral mucosal lesions are identified. Neck is clear without evidence of cervical or supraclavicular adenopathy. Lungs are clear to A&P. Cardiac examination is essentially unremarkable with regular rate and rhythm without murmur rub or thrill. Abdomen is benign with no organomegaly or masses noted. Motor sensory and DTR levels are equal and symmetric in the upper and lower extremities. Cranial nerves II through XII are grossly intact. Proprioception is intact. No peripheral adenopathy or edema is identified. No motor or sensory levels are noted. Crude visual fields are within normal range.  RADIOLOGY RESULTS: Ultrasound performed for volume study  PLAN: Patient was taken to the cystoscopy suite in the OR. Patient was placed in the low lithotomy position. Foley catheter was placed. Trans-rectal ultrasound probe was inserted into the rectum and prostate seminal vesicles  were visualized as well as bladder base. stepping images were performed on a 5 mm increments. Images will be placed in Brachy[facility] treatment planning system to determine seed placement coordinates for eventual I-125 interstitial implant. Images will be reviewed with the physics and dosimetry staff for final quality approval. I personally was present for the volume study and assisted in delineation of contour volumes.  At the end of the procedure Foley catheter was removed, rectal ultrasound probe was removed. Patient tolerated his procedures extremely well with no side effects or complaints. Patient has given appointment for interstitial implant date. Consent was signed today as well as history and physical performed in preparation for his outpatient surgical implant.      Armstead Peaks., MD

## 2016-08-20 NOTE — Anesthesia Preprocedure Evaluation (Addendum)
Anesthesia Evaluation  Patient identified by MRN, date of birth, ID band Patient awake    Reviewed: Allergy & Precautions, H&P , NPO status , Patient's Chart, lab work & pertinent test results, reviewed documented beta blocker date and time   Airway Mallampati: II   Neck ROM: full    Dental  (+) Poor Dentition   Pulmonary neg pulmonary ROS, Current Smoker,    Pulmonary exam normal        Cardiovascular negative cardio ROS Normal cardiovascular exam Rhythm:regular Rate:Normal     Neuro/Psych negative neurological ROS  negative psych ROS   GI/Hepatic negative GI ROS, Neg liver ROS,   Endo/Other  negative endocrine ROS  Renal/GU negative Renal ROS  negative genitourinary   Musculoskeletal   Abdominal   Peds  Hematology negative hematology ROS (+)   Anesthesia Other Findings Past Medical History: No date: Prostate cancer [facility]) Past Surgical History: 2015: HIP SURGERY Right No date: JOINT REPLACEMENT Right     Comment: hip No date: PROSTATE BIOPSY No date: TONSILLECTOMY BMI    Body Mass Index:  36.49 kg/m     Reproductive/Obstetrics negative OB ROS                             Anesthesia Physical Anesthesia Plan  ASA: II  Anesthesia Plan: General LMA   Post-op Pain Management:    Induction:   Airway Management Planned:   Additional Equipment:   Intra-op Plan:   Post-operative Plan:   Informed Consent: I have reviewed the patients History and Physical, chart, labs and discussed the procedure including the risks, benefits and alternatives for the proposed anesthesia with the patient or authorized representative who has indicated his/her understanding and acceptance.   Dental Advisory Given  Plan Discussed with: CRNA  Anesthesia Plan Comments:         Anesthesia Quick Evaluation

## 2016-09-11 ENCOUNTER — Other Ambulatory Visit: Payer: Self-pay | Admitting: *Deleted

## 2016-09-11 ENCOUNTER — Ambulatory Visit
Admission: RE | Admit: 2016-09-11 | Discharge: 2016-09-11 | Disposition: A | Discharge status: Home / Self Care | Payer: Managed Care, Other (non HMO) | Source: Ambulatory Visit | Attending: Radiation Oncology | Admitting: Radiation Oncology

## 2016-09-11 ENCOUNTER — Encounter: Payer: Self-pay | Admitting: Radiation Oncology

## 2016-09-11 VITALS — BP 137/86 | HR 81 | Temp 97.9°F | Resp 20 | Wt 252.3 lb

## 2016-09-11 DIAGNOSIS — C61 Malignant neoplasm of prostate: Secondary | ICD-10-CM | POA: Diagnosis present

## 2016-09-11 DIAGNOSIS — N529 Male erectile dysfunction, unspecified: Secondary | ICD-10-CM | POA: Diagnosis not present

## 2016-09-11 DIAGNOSIS — Z923 Personal history of irradiation: Secondary | ICD-10-CM | POA: Insufficient documentation

## 2016-09-11 MED ORDER — SILDENAFIL CITRATE 20 MG PO TABS: ORAL_TABLET | ORAL | 0 refills | Status: DC

## 2016-09-11 NOTE — Progress Notes (Signed)
Radiation Oncology Follow up Note  Name: Bradley Shaw   Date:   09/11/2016 MRN:  [id number] DOB: 1965/05/08    This 51 y.o. male presents to the clinic today for one-month follow-up status post I-125 interstitial implant.  REFERRING PROVIDER: Valera Castle, *  HPI: Patient is a 51 year old male presented with a PSA of 13.6 biopsy positive for Gleason 7 (3+4) in 8 of 12 core biopsies. He underwent both external beam radiation therapy as well as I-125 interstitial implant which was completed one month prior. He is seen today in routine follow-up and is doing well. He states his urinary symptoms have improved he still does have erectile dysfunction..  COMPLICATIONS OF TREATMENT: none  FOLLOW UP COMPLIANCE: keeps appointments   PHYSICAL EXAM:  BP 137/86   Pulse 81   Temp 97.9 F (36.6 C)   Resp 20   Wt 252 lb 5.1 oz (114.4 kg)   BMI 38.36 kg/m  On rectal exam rectal sphincter tone is good. Prostate is smooth contracted without evidence of nodularity or mass. Sulcus is preserved bilaterally. No discrete nodularity is identified. No other rectal abnormalities are noted. Well-developed well-nourished patient in NAD. HEENT reveals PERLA, EOMI, discs not visualized.  Oral cavity is clear. No oral mucosal lesions are identified. Neck is clear without evidence of cervical or supraclavicular adenopathy. Lungs are clear to A&P. Cardiac examination is essentially unremarkable with regular rate and rhythm without murmur rub or thrill. Abdomen is benign with no organomegaly or masses noted. Motor sensory and DTR levels are equal and symmetric in the upper and lower extremities. Cranial nerves II through XII are grossly intact. Proprioception is intact. No peripheral adenopathy or edema is identified. No motor or sensory levels are noted. Crude visual fields are within normal range.  RADIOLOGY RESULTS: CT scan for QA was performed showing excellent placement of seed sources  PLAN: Present  time he is doing well slowly recuperating from both external beam radiation and I-125 interstitial implant used as a boost. I have given him a prescription for Viagra to be used as directed. I've assured him over time erectile dysfunction should improve. I've asked to see her back in 3-4 months at which time I will check his PSA. Patient knows to call sooner with any concerns.  I would like to take this opportunity to thank you for allowing me to participate in the care of your patient.Armstead Peaks., MD

## 2016-09-20 ENCOUNTER — Encounter: Payer: Self-pay | Admitting: Urology

## 2016-09-20 ENCOUNTER — Ambulatory Visit (INDEPENDENT_AMBULATORY_CARE_PROVIDER_SITE_OTHER): Payer: Managed Care, Other (non HMO) | Admitting: Urology

## 2016-09-20 VITALS — BP 132/73 | HR 89 | Ht 68.0 in | Wt 251.5 lb

## 2016-09-20 DIAGNOSIS — C61 Malignant neoplasm of prostate: Secondary | ICD-10-CM

## 2016-09-20 DIAGNOSIS — N400 Enlarged prostate without lower urinary tract symptoms: Secondary | ICD-10-CM

## 2016-09-20 DIAGNOSIS — N529 Male erectile dysfunction, unspecified: Secondary | ICD-10-CM

## 2016-09-20 NOTE — Progress Notes (Signed)
09/20/2016 3:34 PM   [name] 1965/08/14 [id number]  Referring provider: Valera Castle, [place] [address] [place], [place] [address]  Chief Complaint  Patient presents with  . Follow-up    from Brachytherapy    HPI: 1. 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 Bilateral apical, lateral and base positivity. Had rad-onc opinion from Dr. Baruch Gouty who outlined aggressive path with ADT (1.5-2 years) + XRT + brachy boost should he want radiation. He completed external beam radiation and underwent brachytherapy on 08/20/2016. He received Lupron 30 mg on 06/12/2016.  2 - Erectile Dysfunction- on sildenafil prn for trouble maintaining erection.   3 - BPH - on flomax with good results. Good stream. Minimal nocturia. Feels like he empties his bladder.    PMH: Past Medical History:  Diagnosis Date  . Prostate cancer [facility])     Surgical History: Past Surgical History:  Procedure Laterality Date  . HIP SURGERY Right 2015  . JOINT REPLACEMENT Right    hip  . PROSTATE BIOPSY    . RADIOACTIVE SEED IMPLANT N/A 08/20/2016   Procedure: RADIOACTIVE SEED IMPLANT/BRACHYTHERAPY IMPLANT;  Surgeon: Hollice Espy, MD;  Location: [facility] ORS;  Service: Urology;  Laterality: N/A;  . TONSILLECTOMY      Home Medications:  Allergies as of 09/20/2016      Reactions   Shellfish Allergy Anaphylaxis   Hives & throat closes      Medication List       Accurate as of 09/20/16  3:34 PM. Always use your most recent med list.          ciprofloxacin 500 MG tablet Commonly known as:  CIPRO Take 1 tablet (500 mg total) by mouth 2 (two) times daily.   CLEAR EYES COMPLETE OP Place 1 drop into both eyes 3 (three) times daily as needed (dry eyes).   docusate sodium 100 MG capsule Commonly known as:  COLACE Take 1 capsule (100 mg total) by mouth 2 (two)  times daily.   HYDROcodone-acetaminophen 5-325 MG tablet Commonly known as:  NORCO/VICODIN Take 1-2 tablets by mouth every 6 (six) hours as needed for moderate pain.   ibuprofen 200 MG tablet Commonly known as:  ADVIL,MOTRIN Take 400 mg by mouth 2 (two) times daily as needed for moderate pain.   sildenafil 20 MG tablet Commonly known as:  REVATIO May use 1-5 tabs daily 1/2 hr before intercourse   tamsulosin 0.4 MG Caps capsule Commonly known as:  FLOMAX Take 1 capsule (0.4 mg total) by mouth daily after supper.   trolamine salicylate 10 % cream Commonly known as:  ASPERCREME Apply 1 application topically 2 (two) times daily as needed for muscle pain.       Allergies:  Allergies  Allergen Reactions  . Shellfish Allergy Anaphylaxis    Hives & throat closes    Family History: Family History  Problem Relation Age of Onset  . Prostate cancer Neg Hx   . Bladder Cancer Neg Hx   . Kidney cancer Neg Hx     Social History:  reports that he has been smoking Cigarettes.  He has been smoking about 0.25 packs per day. He has never used smokeless tobacco. He reports that he drinks alcohol. He reports that he uses drugs, including Marijuana.  ROS: UROLOGY Frequent Urination?: No Hard to postpone urination?: No Burning/pain with urination?:  No Get up at night to urinate?: No Leakage of urine?: No Urine stream starts and stops?: No Trouble starting stream?: No Do you have to strain to urinate?: No Blood in urine?: No Urinary tract infection?: No Sexually transmitted disease?: No Injury to kidneys or bladder?: No Painful intercourse?: No Weak stream?: No Erection problems?: No Penile pain?: No  Gastrointestinal Nausea?: No Vomiting?: No Indigestion/heartburn?: No Diarrhea?: No Constipation?: No  Constitutional Fever: No Night sweats?: No Weight loss?: No Fatigue?: No  Skin Skin rash/lesions?: No Itching?: No  Eyes Blurred vision?: No Double vision?:  No  Ears/Nose/Throat Sore throat?: No Sinus problems?: No  Hematologic/Lymphatic Swollen glands?: No Easy bruising?: No  Cardiovascular Leg swelling?: No Chest pain?: No  Respiratory Cough?: No Shortness of breath?: No  Endocrine Excessive thirst?: No  Musculoskeletal Back pain?: No Joint pain?: No  Neurological Headaches?: No Dizziness?: No  Psychologic Depression?: No Anxiety?: No  Physical Exam: BP 132/73 (BP Location: Left Arm, Patient Position: Sitting, Cuff Size: Normal)   Pulse 89   Ht 5\' 8"  (1.727 m)   Wt 251 lb 8 oz (114.1 kg)   BMI 38.24 kg/m   Constitutional:  Alert and oriented, No acute distress. HEENT: [place] AT, moist mucus membranes.  Trachea midline, no masses. Cardiovascular: No clubbing, cyanosis, or edema. Respiratory: Normal respiratory effort, no increased work of breathing. GI: Abdomen is soft, nontender, nondistended, no abdominal masses GU: No CVA tenderness.  Skin: No rashes, bruises or suspicious lesions. Lymph: No cervical or inguinal adenopathy. Neurologic: Grossly intact, no focal deficits, moving all 4 extremities. Psychiatric: Normal mood and affect.  Laboratory Data: Lab Results  Component Value Date   WBC 4.5 07/19/2016   HGB 13.7 07/19/2016   HCT 39.0 (L) 07/19/2016   MCV 89.0 07/19/2016   PLT 175 07/19/2016    Lab Results  Component Value Date   CREATININE 0.99 04/02/2014    No results found for: PSA  No results found for: TESTOSTERONE  No results found for: HGBA1C  Urinalysis    Component Value Date/Time   COLORURINE Yellow 03/24/2014 0904   APPEARANCEUR Clear 01/11/2016 1331   LABSPEC 1.012 03/24/2014 0904   PHURINE 5.0 03/24/2014 0904   GLUCOSEU Negative 01/11/2016 1331   GLUCOSEU Negative 03/24/2014 0904   HGBUR Negative 03/24/2014 0904   BILIRUBINUR Negative 01/11/2016 1331   BILIRUBINUR Negative 03/24/2014 0904   KETONESUR Negative 03/24/2014 0904   PROTEINUR Negative 01/11/2016 1331    PROTEINUR Negative 03/24/2014 0904   NITRITE Negative 01/11/2016 1331   NITRITE Negative 03/24/2014 0904   LEUKOCYTESUR Negative 01/11/2016 1331   LEUKOCYTESUR Negative 03/24/2014 0904     Assessment & Plan:    1. Large Volume Moderate Risk Prostate Cancer -due fore repeat lupron after 10/10/16. Scheduled to receive at [facility] -Since she is scheduled to follow-up with the PSA with Dr. Baruch Gouty in 3 months for a PSA, I'll see him back in 6 months the PSA prior to trend his response to treatment.  2 - Erectile Dysfunction- frankly discussed this would worsen with ANY prostate cancer treatment.   3 - BPH - continue flomax  Return in about 6 months (around 03/22/2017) for PSA prior.  Nickie Retort, MD  [facility] [address] [place], [place] [address] [phone]

## 2016-10-10 ENCOUNTER — Inpatient Hospital Stay: Payer: Managed Care, Other (non HMO) | Attending: Radiation Oncology

## 2016-10-10 DIAGNOSIS — C61 Malignant neoplasm of prostate: Secondary | ICD-10-CM | POA: Insufficient documentation

## 2016-10-10 DIAGNOSIS — Z79818 Long term (current) use of other agents affecting estrogen receptors and estrogen levels: Secondary | ICD-10-CM | POA: Diagnosis not present

## 2016-10-10 MED ORDER — LEUPROLIDE ACETATE (4 MONTH) 30 MG IM KIT
30.0000 mg | PACK | Freq: Once | INTRAMUSCULAR | Status: AC
  Administered 2016-10-10: 30 mg via INTRAMUSCULAR
  Filled 2016-10-10: qty 30

## 2016-10-12 DIAGNOSIS — C61 Malignant neoplasm of prostate: Secondary | ICD-10-CM | POA: Diagnosis not present

## 2016-12-13 ENCOUNTER — Encounter: Payer: Self-pay | Admitting: *Deleted

## 2016-12-20 ENCOUNTER — Other Ambulatory Visit: Payer: Self-pay | Admitting: Radiation Oncology

## 2016-12-31 ENCOUNTER — Inpatient Hospital Stay: Payer: Managed Care, Other (non HMO) | Attending: Radiation Oncology

## 2016-12-31 ENCOUNTER — Encounter: Payer: Self-pay | Admitting: *Deleted

## 2016-12-31 DIAGNOSIS — C61 Malignant neoplasm of prostate: Secondary | ICD-10-CM | POA: Insufficient documentation

## 2016-12-31 LAB — PSA: Prostatic Specific Antigen: 0.07 ng/mL (ref 0.00–4.00)

## 2017-01-04 ENCOUNTER — Ambulatory Visit: Payer: Managed Care, Other (non HMO) | Admitting: Radiation Oncology

## 2017-01-07 ENCOUNTER — Ambulatory Visit: Payer: Managed Care, Other (non HMO) | Admitting: Radiation Oncology

## 2017-01-08 ENCOUNTER — Inpatient Hospital Stay: Payer: Managed Care, Other (non HMO)

## 2017-01-08 ENCOUNTER — Other Ambulatory Visit: Payer: Self-pay | Admitting: *Deleted

## 2017-01-08 ENCOUNTER — Ambulatory Visit
Admission: RE | Admit: 2017-01-08 | Discharge: 2017-01-08 | Disposition: A | Discharge status: Home / Self Care | Payer: Managed Care, Other (non HMO) | Source: Ambulatory Visit | Attending: Radiation Oncology | Admitting: Radiation Oncology

## 2017-01-08 ENCOUNTER — Encounter: Payer: Self-pay | Admitting: *Deleted

## 2017-01-08 ENCOUNTER — Encounter: Payer: Self-pay | Admitting: Radiation Oncology

## 2017-01-08 VITALS — BP 136/88 | HR 75 | Temp 97.9°F | Ht 67.0 in | Wt 267.7 lb

## 2017-01-08 DIAGNOSIS — C61 Malignant neoplasm of prostate: Secondary | ICD-10-CM

## 2017-01-08 DIAGNOSIS — Z923 Personal history of irradiation: Secondary | ICD-10-CM | POA: Insufficient documentation

## 2017-01-08 DIAGNOSIS — R350 Frequency of micturition: Secondary | ICD-10-CM | POA: Insufficient documentation

## 2017-01-08 DIAGNOSIS — N529 Male erectile dysfunction, unspecified: Secondary | ICD-10-CM | POA: Insufficient documentation

## 2017-01-08 DIAGNOSIS — R351 Nocturia: Secondary | ICD-10-CM | POA: Insufficient documentation

## 2017-01-08 LAB — LIPID PANEL
Cholesterol: 228 mg/dL — ABNORMAL HIGH (ref 0–200)
HDL: 45 mg/dL (ref >40)
LDL CALC: 157 mg/dL — AB (ref 0–99)
Total CHOL/HDL Ratio: 5.1 RATIO
Triglycerides: 130 mg/dL (ref <150)
VLDL: 26 mg/dL (ref 0–40)

## 2017-01-08 LAB — GLUCOSE, RANDOM: GLUCOSE: 104 mg/dL — AB (ref 65–99)

## 2017-01-08 NOTE — Progress Notes (Signed)
Radiation Oncology Follow up Note  Name: Bradley Shaw   Date:   01/08/2017 MRN:  [id number] DOB: [birth year]    This 51 y.o. male presents to the clinic today for 5 month follow-up status post external beam radiation therapy as well as I-125 interstitial implant for boost for Gleason 7 adenocarcinoma the prostate.  REFERRING PROVIDER: Valera Castle, *  HPI: Patient is a 51 year old male now out 5 months having completed both external beam radiation therapy to his prostate and pelvic nodes as well as I-125 interstitial implant for boost for Gleason 7 (3+4) presenting the PSA of 13.6. He is seen today in routine follow-up. His major concern is still erectile dysfunction he has tried Viagra still is able to achieve an orgasm. He specifically denies urgency frequency or diarrhea.  COMPLICATIONS OF TREATMENT: none  FOLLOW UP COMPLIANCE: keeps appointments   PHYSICAL EXAM:  BP 136/88   Pulse 75   Temp 97.9 F (36.6 C)   Ht 5\' 7"  (1.702 m)   Wt 267 lb 12 oz (121.5 kg)   BMI 41.94 kg/m  On rectal exam rectal sphincter tone is good. Prostate is smooth contracted without evidence of nodularity or mass. Sulcus is preserved bilaterally. No discrete nodularity is identified. No other rectal abnormalities are noted. Well-developed well-nourished patient in NAD. HEENT reveals PERLA, EOMI, discs not visualized.  Oral cavity is clear. No oral mucosal lesions are identified. Neck is clear without evidence of cervical or supraclavicular adenopathy. Lungs are clear to A&P. Cardiac examination is essentially unremarkable with regular rate and rhythm without murmur rub or thrill. Abdomen is benign with no organomegaly or masses noted. Motor sensory and DTR levels are equal and symmetric in the upper and lower extremities. Cranial nerves II through XII are grossly intact. Proprioception is intact. No peripheral adenopathy or edema is identified. No motor or sensory levels are noted. Crude visual fields  are within normal range.  RADIOLOGY RESULTS: No current films for review  PLAN: Present time patient is doing well. He continues on Flomax to help with some of his nocturia and frequency. Patient is scheduled for follow-up Lupron injection. We discussed that he is having some hot flashes would like to wait a little bit on that. He is also discussed possible penile implant as well as contact the urology for further options as far as his erectile dysfunction. I've otherwise asked to see him back in 6 months for follow-up with a PSA prior to that visit.  I would like to take this opportunity to thank you for allowing me to participate in the care of your patient.Armstead Peaks., MD

## 2017-01-10 ENCOUNTER — Other Ambulatory Visit: Payer: Self-pay | Admitting: *Deleted

## 2017-01-10 DIAGNOSIS — C61 Malignant neoplasm of prostate: Secondary | ICD-10-CM

## 2017-02-12 ENCOUNTER — Inpatient Hospital Stay: Payer: Managed Care, Other (non HMO) | Attending: Radiation Oncology

## 2017-02-12 DIAGNOSIS — Z79818 Long term (current) use of other agents affecting estrogen receptors and estrogen levels: Secondary | ICD-10-CM | POA: Diagnosis not present

## 2017-02-12 DIAGNOSIS — C61 Malignant neoplasm of prostate: Secondary | ICD-10-CM | POA: Insufficient documentation

## 2017-02-12 MED ORDER — LEUPROLIDE ACETATE (4 MONTH) 30 MG IM KIT
30.0000 mg | PACK | Freq: Once | INTRAMUSCULAR | Status: AC
  Administered 2017-02-12: 30 mg via INTRAMUSCULAR
  Filled 2017-02-12: qty 30

## 2017-02-13 ENCOUNTER — Encounter: Payer: Self-pay | Admitting: *Deleted

## 2017-03-21 ENCOUNTER — Ambulatory Visit: Payer: Managed Care, Other (non HMO) | Admitting: Urology

## 2017-03-28 ENCOUNTER — Ambulatory Visit (INDEPENDENT_AMBULATORY_CARE_PROVIDER_SITE_OTHER): Payer: Managed Care, Other (non HMO) | Admitting: Urology

## 2017-03-28 ENCOUNTER — Encounter: Payer: Self-pay | Admitting: Urology

## 2017-03-28 VITALS — BP 137/85 | HR 87 | Ht 67.0 in | Wt 267.0 lb

## 2017-03-28 DIAGNOSIS — C61 Malignant neoplasm of prostate: Secondary | ICD-10-CM | POA: Diagnosis not present

## 2017-03-28 NOTE — Progress Notes (Signed)
03/28/2017 9:42 AM   Bradley Shaw 1965-11-05 [id number]  Referring provider: Valera Castle, [address] [place], [place] [address]  Chief Complaint  Patient presents with  . Prostate Cancer    HPI: The patient is a 51 year old gentleman presents today for six-month follow-up.  1. Large Volume Moderate Risk Prostate Cancer- 8/12 cores with up to 75% of core Gleason 3+4=7 adenocarcinoma by biopsy 04/2016 on eval rising PSA to 13.6.TRUS 34 mL, no median lobe. Bilateral apical, lateral and base positivity. Had rad-onc opinion from Dr. Baruch Gouty who outlined aggressive path with ADT (1.5-2 years) + XRT + brachy boost should he want radiation. He completed external beam radiation and underwent brachytherapy on 08/20/2016. He received first Lupron 30 mg on 06/12/2016. His most recent dose was 30 mg lupron on 02/12/17.  PSA in Spetember 2018 was 0.07  2 - Erectile Dysfunction- on sildenafil prn for trouble maintaining erection.  He does not take it every time.  When he does he takes 4 pills.  He still has normal libido despite his Lupron therapy.  3 - BPH - on flomax with good results. Good stream. Minimal nocturia. Feels like he empties his bladder.     PMH: Past Medical History:  Diagnosis Date  . Prostate cancer [facility])     Surgical History: Past Surgical History:  Procedure Laterality Date  . HIP SURGERY Right 2015  . JOINT REPLACEMENT Right    hip  . PROSTATE BIOPSY    . RADIOACTIVE SEED IMPLANT N/A 08/20/2016   Procedure: RADIOACTIVE SEED IMPLANT/BRACHYTHERAPY IMPLANT;  Surgeon: Hollice Espy, MD;  Location: [facility] ORS;  Service: Urology;  Laterality: N/A;  . TONSILLECTOMY      Home Medications:  Allergies as of 03/28/2017      Reactions   Shellfish Allergy Anaphylaxis   Hives & throat closes      Medication List        Accurate as of 03/28/17  9:42 AM. Always use your most recent med list.          CLEAR EYES COMPLETE OP Place 1 drop into  both eyes 3 (three) times daily as needed (dry eyes).   ibuprofen 200 MG tablet Commonly known as:  ADVIL,MOTRIN Take 400 mg by mouth 2 (two) times daily as needed for moderate pain.   sildenafil 20 MG tablet Commonly known as:  REVATIO May use 1-5 tabs daily 1/2 hr before intercourse   tamsulosin 0.4 MG Caps capsule Commonly known as:  FLOMAX TAKE 1 CAPSULE (0.4 MG TOTAL) BY MOUTH DAILY AFTER SUPPER.   trolamine salicylate 10 % cream Commonly known as:  ASPERCREME Apply 1 application topically 2 (two) times daily as needed for muscle pain.       Allergies:  Allergies  Allergen Reactions  . Shellfish Allergy Anaphylaxis    Hives & throat closes    Family History: Family History  Problem Relation Age of Onset  . Prostate cancer Neg Hx   . Bladder Cancer Neg Hx   . Kidney cancer Neg Hx     Social History:  reports that he has been smoking cigarettes.  He has been smoking about 0.25 packs per day. he has never used smokeless tobacco. He reports that he drinks alcohol. He reports that he uses drugs. Drug: Marijuana.  ROS: UROLOGY Frequent Urination?: No Hard to postpone urination?: No Burning/pain with urination?: No Get up at night to urinate?: Yes Leakage of urine?: No Urine stream starts and stops?: No Trouble  starting stream?: No Do you have to strain to urinate?: No Blood in urine?: No Urinary tract infection?: No Sexually transmitted disease?: No Injury to kidneys or bladder?: No Painful intercourse?: No Weak stream?: No Erection problems?: No Penile pain?: No  Gastrointestinal Nausea?: No Vomiting?: No Indigestion/heartburn?: No Diarrhea?: No Constipation?: No  Constitutional Fever: No Night sweats?: Yes Weight loss?: No Fatigue?: No  Skin Skin rash/lesions?: No Itching?: No  Eyes Blurred vision?: No Double vision?: No  Ears/Nose/Throat Sore throat?: No Sinus problems?: No  Hematologic/Lymphatic Swollen glands?: No Easy bruising?:  No  Cardiovascular Leg swelling?: No Chest pain?: No  Respiratory Cough?: No Shortness of breath?: No  Endocrine Excessive thirst?: No  Musculoskeletal Back pain?: No Joint pain?: No  Neurological Headaches?: No Dizziness?: No  Psychologic Depression?: No Anxiety?: No  Physical Exam: BP 137/85 (BP Location: Right Arm, Patient Position: Sitting, Cuff Size: Large)   Pulse 87   Ht 5\' 7"  (1.702 m)   Wt 267 lb (121.1 kg)   BMI 41.82 kg/m   Constitutional:  Alert and oriented, No acute distress. HEENT: [place] AT, moist mucus membranes.  Trachea midline, no masses. Cardiovascular: No clubbing, cyanosis, or edema. Respiratory: Normal respiratory effort, no increased work of breathing. GI: Abdomen is soft, nontender, nondistended, no abdominal masses GU: No CVA tenderness.  Skin: No rashes, bruises or suspicious lesions. Lymph: No cervical or inguinal adenopathy. Neurologic: Grossly intact, no focal deficits, moving all 4 extremities. Psychiatric: Normal mood and affect.  Laboratory Data: Lab Results  Component Value Date   WBC 4.5 07/19/2016   HGB 13.7 07/19/2016   HCT 39.0 (L) 07/19/2016   MCV 89.0 07/19/2016   PLT 175 07/19/2016    Lab Results  Component Value Date   CREATININE 0.99 04/02/2014    No results found for: PSA  No results found for: TESTOSTERONE  No results found for: HGBA1C  Urinalysis    Component Value Date/Time   COLORURINE Yellow 03/24/2014 0904   APPEARANCEUR Clear 01/11/2016 1331   LABSPEC 1.012 03/24/2014 0904   PHURINE 5.0 03/24/2014 0904   GLUCOSEU Negative 01/11/2016 1331   GLUCOSEU Negative 03/24/2014 0904   HGBUR Negative 03/24/2014 0904   BILIRUBINUR Negative 01/11/2016 1331   BILIRUBINUR Negative 03/24/2014 0904   KETONESUR Negative 03/24/2014 0904   PROTEINUR Negative 01/11/2016 1331   PROTEINUR Negative 03/24/2014 0904   NITRITE Negative 01/11/2016 1331   NITRITE Negative 03/24/2014 0904   LEUKOCYTESUR Negative  01/11/2016 1331   LEUKOCYTESUR Negative 03/24/2014 0904     Assessment & Plan:    1.  Prostate cancer -We will check PSA today. -Follow-up in 6 months with PSA prior -Continue androgen deprivation therapy per Dr. Baruch Gouty for 1-2 years  2.  Erectile dysfunction I discussed treatment options with him.  He will increase his sildenafil to 5 pills.  He will also consider obtaining a vacuum erection device.  He is not interested in penile injection therapy.  He is also not interested in a prosthesis which I strongly recommend against at this point as his erectile dysfunction will likely improve once he is off androgen deprivation therapy.  3. BPH -continue flomax   Return in about 6 months (around 09/26/2017) for PSA prior.  Nickie Retort, MD  [facility] [address], [place], [place] [address] [phone]

## 2017-03-29 LAB — PSA

## 2017-04-05 ENCOUNTER — Other Ambulatory Visit: Payer: Self-pay | Admitting: *Deleted

## 2017-04-05 ENCOUNTER — Other Ambulatory Visit: Payer: Self-pay | Admitting: Radiation Oncology

## 2017-04-05 MED ORDER — SILDENAFIL CITRATE 20 MG PO TABS: ORAL_TABLET | ORAL | 0 refills | Status: DC

## 2017-07-03 ENCOUNTER — Other Ambulatory Visit: Payer: Managed Care, Other (non HMO)

## 2017-07-10 ENCOUNTER — Ambulatory Visit: Payer: Managed Care, Other (non HMO) | Admitting: Radiation Oncology

## 2017-07-19 ENCOUNTER — Inpatient Hospital Stay: Payer: Managed Care, Other (non HMO) | Attending: Radiation Oncology

## 2017-07-19 ENCOUNTER — Encounter: Payer: Self-pay | Admitting: *Deleted

## 2017-07-19 DIAGNOSIS — C61 Malignant neoplasm of prostate: Secondary | ICD-10-CM | POA: Insufficient documentation

## 2017-07-19 LAB — PSA: Prostatic Specific Antigen: 0.09 ng/mL (ref 0.00–4.00)

## 2017-07-26 ENCOUNTER — Other Ambulatory Visit: Payer: Self-pay | Admitting: *Deleted

## 2017-07-26 ENCOUNTER — Other Ambulatory Visit: Payer: Self-pay

## 2017-07-26 ENCOUNTER — Ambulatory Visit
Admission: RE | Admit: 2017-07-26 | Discharge: 2017-07-26 | Disposition: A | Discharge status: Home / Self Care | Payer: Managed Care, Other (non HMO) | Source: Ambulatory Visit | Attending: Radiation Oncology | Admitting: Radiation Oncology

## 2017-07-26 ENCOUNTER — Encounter: Payer: Self-pay | Admitting: *Deleted

## 2017-07-26 ENCOUNTER — Encounter: Payer: Self-pay | Admitting: Radiation Oncology

## 2017-07-26 VITALS — BP 141/91 | HR 83 | Temp 97.8°F | Resp 18 | Wt 270.5 lb

## 2017-07-26 DIAGNOSIS — Z923 Personal history of irradiation: Secondary | ICD-10-CM | POA: Insufficient documentation

## 2017-07-26 DIAGNOSIS — C61 Malignant neoplasm of prostate: Secondary | ICD-10-CM | POA: Diagnosis not present

## 2017-07-26 MED ORDER — SILDENAFIL CITRATE 20 MG PO TABS: ORAL_TABLET | ORAL | 0 refills | Status: DC

## 2017-07-26 NOTE — Progress Notes (Signed)
Radiation Oncology Follow up Note  Name: Bradley Shaw   Date:   07/26/2017 MRN:  [id number] DOB: [birth year]    This 52 y.o. male presents to the clinic today for 11 month follow-up status post external reviewed beam radiation therapy as well as I-125 interstitial implant for Gleason 7 adenocarcinoma the prostate.  REFERRING PROVIDER: Valera Castle, *  HPI: patient is a 52 year old male now out 11 months having completed external beam radiation therapy to his prostate and pelvic nodes as well as and I-125 interstitial implant for Gleason 7 (3+4) adenocarcinoma the prostate presenting the PSA of 13.6. He is seen today in routine follow-up and is doing well. He specifically denies diarrhea dysuria or any other GI/GU complaints. Most recent PSA was 0.09.Marland Kitchen  COMPLICATIONS OF TREATMENT: none  FOLLOW UP COMPLIANCE: keeps appointments   PHYSICAL EXAM:  BP (!) 141/91   Pulse 83   Temp 97.8 F (36.6 C)   Resp 18   Wt 270 lb 8.1 oz (122.7 kg)   BMI 42.37 kg/m  Well-developed well-nourished patient in NAD. HEENT reveals PERLA, EOMI, discs not visualized.  Oral cavity is clear. No oral mucosal lesions are identified. Neck is clear without evidence of cervical or supraclavicular adenopathy. Lungs are clear to A&P. Cardiac examination is essentially unremarkable with regular rate and rhythm without murmur rub or thrill. Abdomen is benign with no organomegaly or masses noted. Motor sensory and DTR levels are equal and symmetric in the upper and lower extremities. Cranial nerves II through XII are grossly intact. Proprioception is intact. No peripheral adenopathy or edema is identified. No motor or sensory levels are noted. Crude visual fields are within normal range.  RADIOLOGY RESULTS: no current films for review  PLAN: present time he continues under excellent biochemical control of his prostate cancer.he continues Lupron suppression under urology's care. I'm please was overall progress.  I've asked to see him back in 1 year for follow-up. He continues close follow-up care with urology. Patient knows to call with any concerns.  I would like to take this opportunity to thank you for allowing me to participate in the care of your patient.Noreene Filbert, MD

## 2017-09-20 ENCOUNTER — Other Ambulatory Visit: Payer: Managed Care, Other (non HMO)

## 2017-09-27 ENCOUNTER — Ambulatory Visit: Payer: Managed Care, Other (non HMO)

## 2018-01-15 ENCOUNTER — Other Ambulatory Visit: Payer: Self-pay | Admitting: Orthopedic Surgery

## 2018-01-15 DIAGNOSIS — G8929 Other chronic pain: Secondary | ICD-10-CM

## 2018-01-15 DIAGNOSIS — M5441 Lumbago with sciatica, right side: Principal | ICD-10-CM

## 2018-01-29 ENCOUNTER — Ambulatory Visit
Admission: RE | Admit: 2018-01-29 | Discharge: 2018-01-29 | Disposition: A | Discharge status: Home / Self Care | Payer: Managed Care, Other (non HMO) | Source: Ambulatory Visit | Attending: Orthopedic Surgery | Admitting: Orthopedic Surgery

## 2018-06-09 ENCOUNTER — Encounter: Payer: Self-pay | Admitting: *Deleted

## 2018-07-25 ENCOUNTER — Other Ambulatory Visit: Payer: Self-pay

## 2018-07-25 ENCOUNTER — Inpatient Hospital Stay: Payer: Managed Care, Other (non HMO) | Attending: Radiation Oncology

## 2018-07-25 DIAGNOSIS — C61 Malignant neoplasm of prostate: Secondary | ICD-10-CM | POA: Diagnosis not present

## 2018-07-25 LAB — PSA: Prostatic Specific Antigen: 0.24 ng/mL (ref 0.00–4.00)

## 2018-08-01 ENCOUNTER — Ambulatory Visit: Payer: Managed Care, Other (non HMO) | Admitting: Radiation Oncology

## 2018-08-15 ENCOUNTER — Encounter: Payer: Self-pay | Admitting: *Deleted

## 2018-08-15 ENCOUNTER — Encounter: Payer: Self-pay | Admitting: Radiation Oncology

## 2018-08-15 ENCOUNTER — Ambulatory Visit
Admission: RE | Admit: 2018-08-15 | Discharge: 2018-08-15 | Disposition: A | Discharge status: Home / Self Care | Payer: Managed Care, Other (non HMO) | Source: Ambulatory Visit | Attending: Radiation Oncology | Admitting: Radiation Oncology

## 2018-08-15 ENCOUNTER — Other Ambulatory Visit: Payer: Self-pay

## 2018-08-15 VITALS — BP 146/80 | HR 75 | Temp 96.5°F | Resp 18 | Wt 265.1 lb

## 2018-08-15 DIAGNOSIS — Z923 Personal history of irradiation: Secondary | ICD-10-CM | POA: Insufficient documentation

## 2018-08-15 DIAGNOSIS — C61 Malignant neoplasm of prostate: Secondary | ICD-10-CM | POA: Insufficient documentation

## 2018-08-15 NOTE — Progress Notes (Signed)
Radiation Oncology Follow up Note  Name: Bradley Shaw   Date:   08/15/2018 MRN:  [id number] DOB: [birth year]    This 53 y.o. male presents to the clinic today for 2-year follow-up status post external beam radiation therapy as well as I-125 interstitial implant as well as androgen deprivation therapy for Gleason 7 adenocarcinoma the prostate.  REFERRING PROVIDER: Valera Castle, *  HPI: Patient is a 53 year old male now seen now 2 years having completed triple modality therapy including external beam radiation to his prostate and pelvic nodes as well as a I-125 interstitial implant for boost as well as androgen deprivation therapy for a Gleason 7 (3+4) that friends presenting with a PSA of 13.6.  He is seen today in routine follow-up and is doing well.  He specifically denies any increased lower urinary tract symptoms diarrhea or fatigue.Bradley Shaw  His most recent PSA was 0.24 up from 0.09      1-year prior.  He specifically denies any bone pain continues to work full-time  COMPLICATIONS OF TREATMENT: none  FOLLOW UP COMPLIANCE: keeps appointments   PHYSICAL EXAM:  BP (!) 146/80 (BP Location: Left Arm, Patient Position: Sitting)   Pulse 75   Temp (!) 96.5 F (35.8 C) (Tympanic)   Resp 18   Wt 265 lb 1.7 oz (120.2 kg)   BMI 41.52 kg/m  Well-developed well-nourished patient in NAD. HEENT reveals PERLA, EOMI, discs not visualized.  Oral cavity is clear. No oral mucosal lesions are identified. Neck is clear without evidence of cervical or supraclavicular adenopathy. Lungs are clear to A&P. Cardiac examination is essentially unremarkable with regular rate and rhythm without murmur rub or thrill. Abdomen is benign with no organomegaly or masses noted. Motor sensory and DTR levels are equal and symmetric in the upper and lower extremities. Cranial nerves II through XII are grossly intact. Proprioception is intact. No peripheral adenopathy or edema is identified. No motor or sensory levels are  noted. Crude visual fields are within normal range.  RADIOLOGY RESULTS: No current films for review  PLAN: Present time patient is doing well under excellent biochemical control of his prostate cancer.  Did have a slight doubling 2.2 from last year not that significant at this time.  I have asked to see him back in 1 year for follow-up with a repeat PSA at that time.  Patient knows to call at anytime with any concerns.  I would like to take this opportunity to thank you for allowing me to participate in the care of your patient.Noreene Filbert, MD

## 2019-05-01 ENCOUNTER — Inpatient Hospital Stay (HOSPITAL_BASED_OUTPATIENT_CLINIC_OR_DEPARTMENT_OTHER)
Admission: EM | Admit: 2019-05-01 | Discharge: 2019-05-02 | DRG: 069 | Disposition: A | Discharge status: Home / Self Care | Payer: Managed Care, Other (non HMO) | Attending: Neurology | Admitting: Neurology

## 2019-05-01 ENCOUNTER — Emergency Department (HOSPITAL_BASED_OUTPATIENT_CLINIC_OR_DEPARTMENT_OTHER): Payer: Managed Care, Other (non HMO)

## 2019-05-01 ENCOUNTER — Inpatient Hospital Stay (HOSPITAL_COMMUNITY): Payer: Managed Care, Other (non HMO)

## 2019-05-01 ENCOUNTER — Encounter (HOSPITAL_BASED_OUTPATIENT_CLINIC_OR_DEPARTMENT_OTHER): Payer: Self-pay

## 2019-05-01 ENCOUNTER — Other Ambulatory Visit: Payer: Self-pay

## 2019-05-01 DIAGNOSIS — F1290 Cannabis use, unspecified, uncomplicated: Secondary | ICD-10-CM | POA: Diagnosis present

## 2019-05-01 DIAGNOSIS — M1990 Unspecified osteoarthritis, unspecified site: Secondary | ICD-10-CM | POA: Diagnosis present

## 2019-05-01 DIAGNOSIS — G459 Transient cerebral ischemic attack, unspecified: Principal | ICD-10-CM | POA: Diagnosis present

## 2019-05-01 DIAGNOSIS — R112 Nausea with vomiting, unspecified: Secondary | ICD-10-CM | POA: Diagnosis present

## 2019-05-01 DIAGNOSIS — Z8546 Personal history of malignant neoplasm of prostate: Secondary | ICD-10-CM | POA: Diagnosis not present

## 2019-05-01 DIAGNOSIS — Z791 Long term (current) use of non-steroidal anti-inflammatories (NSAID): Secondary | ICD-10-CM

## 2019-05-01 DIAGNOSIS — H814 Vertigo of central origin: Secondary | ICD-10-CM | POA: Diagnosis not present

## 2019-05-01 DIAGNOSIS — I6389 Other cerebral infarction: Secondary | ICD-10-CM | POA: Diagnosis not present

## 2019-05-01 DIAGNOSIS — R29701 NIHSS score 1: Secondary | ICD-10-CM | POA: Diagnosis present

## 2019-05-01 DIAGNOSIS — Z9282 Status post administration of tPA (rtPA) in a different facility within the last 24 hours prior to admission to current facility: Secondary | ICD-10-CM | POA: Diagnosis not present

## 2019-05-01 DIAGNOSIS — E785 Hyperlipidemia, unspecified: Secondary | ICD-10-CM | POA: Diagnosis present

## 2019-05-01 DIAGNOSIS — R61 Generalized hyperhidrosis: Secondary | ICD-10-CM | POA: Diagnosis present

## 2019-05-01 DIAGNOSIS — Z79899 Other long term (current) drug therapy: Secondary | ICD-10-CM | POA: Diagnosis not present

## 2019-05-01 DIAGNOSIS — Z20822 Contact with and (suspected) exposure to covid-19: Secondary | ICD-10-CM | POA: Diagnosis present

## 2019-05-01 DIAGNOSIS — I639 Cerebral infarction, unspecified: Secondary | ICD-10-CM | POA: Diagnosis present

## 2019-05-01 DIAGNOSIS — F17210 Nicotine dependence, cigarettes, uncomplicated: Secondary | ICD-10-CM | POA: Diagnosis present

## 2019-05-01 DIAGNOSIS — I6300 Cerebral infarction due to thrombosis of unspecified precerebral artery: Secondary | ICD-10-CM | POA: Diagnosis not present

## 2019-05-01 LAB — URINALYSIS, ROUTINE W REFLEX MICROSCOPIC
Bacteria, UA: NONE SEEN
Bilirubin Urine: NEGATIVE
Glucose, UA: NEGATIVE mg/dL
Ketones, ur: NEGATIVE mg/dL
Leukocytes,Ua: NEGATIVE
Nitrite: NEGATIVE
Protein, ur: 30 mg/dL — AB
Specific Gravity, Urine: >1.046 — ABNORMAL HIGH (ref 1.005–1.030)
pH: 5 (ref 5.0–8.0)

## 2019-05-01 LAB — CBC WITH DIFFERENTIAL/PLATELET
Abs Immature Granulocytes: 0.02 10*3/uL (ref 0.00–0.07)
Basophils Absolute: 0 10*3/uL (ref 0.0–0.1)
Basophils Relative: 0 %
Eosinophils Absolute: 0 10*3/uL (ref 0.0–0.5)
Eosinophils Relative: 1 %
HCT: 44 % (ref 39.0–52.0)
Hemoglobin: 14.7 g/dL (ref 13.0–17.0)
Immature Granulocytes: 0 %
Lymphocytes Relative: 23 %
Lymphs Abs: 1.2 10*3/uL (ref 0.7–4.0)
MCH: 31.3 pg (ref 26.0–34.0)
MCHC: 33.4 g/dL (ref 30.0–36.0)
MCV: 93.6 fL (ref 80.0–100.0)
Monocytes Absolute: 0.5 10*3/uL (ref 0.1–1.0)
Monocytes Relative: 9 %
Neutro Abs: 3.5 10*3/uL (ref 1.7–7.7)
Neutrophils Relative %: 67 %
Platelets: 208 10*3/uL (ref 150–400)
RBC: 4.7 MIL/uL (ref 4.22–5.81)
RDW: 13.9 % (ref 11.5–15.5)
WBC: 5.3 10*3/uL (ref 4.0–10.5)
nRBC: 0 % (ref 0.0–0.2)

## 2019-05-01 LAB — COMPREHENSIVE METABOLIC PANEL
ALT: 31 U/L (ref 0–44)
AST: 32 U/L (ref 15–41)
Albumin: 4.5 g/dL (ref 3.5–5.0)
Alkaline Phosphatase: 84 U/L (ref 38–126)
Anion gap: 10 (ref 5–15)
BUN: 18 mg/dL (ref 6–20)
CO2: 22 mmol/L (ref 22–32)
Calcium: 8.7 mg/dL — ABNORMAL LOW (ref 8.9–10.3)
Chloride: 106 mmol/L (ref 98–111)
Creatinine, Ser: 0.85 mg/dL (ref 0.61–1.24)
GFR calc Af Amer: >60 mL/min (ref >60)
GFR calc non Af Amer: >60 mL/min (ref >60)
Glucose, Bld: 157 mg/dL — ABNORMAL HIGH (ref 70–99)
Potassium: 3.7 mmol/L (ref 3.5–5.1)
Sodium: 138 mmol/L (ref 135–145)
Total Bilirubin: 0.8 mg/dL (ref 0.3–1.2)
Total Protein: 8 g/dL (ref 6.5–8.1)

## 2019-05-01 LAB — CK: Total CK: 1048 U/L — ABNORMAL HIGH (ref 49–397)

## 2019-05-01 LAB — RAPID URINE DRUG SCREEN, HOSP PERFORMED
Amphetamines: NOT DETECTED
Barbiturates: NOT DETECTED
Benzodiazepines: NOT DETECTED
Cocaine: NOT DETECTED
Opiates: NOT DETECTED
Tetrahydrocannabinol: POSITIVE — AB

## 2019-05-01 LAB — MRSA PCR SCREENING: MRSA by PCR: NEGATIVE

## 2019-05-01 LAB — CBG MONITORING, ED: Glucose-Capillary: 116 mg/dL — ABNORMAL HIGH (ref 70–99)

## 2019-05-01 LAB — SARS CORONAVIRUS 2 (TAT 6-24 HRS): SARS Coronavirus 2: NEGATIVE

## 2019-05-01 LAB — HIV ANTIBODY (ROUTINE TESTING W REFLEX): HIV Screen 4th Generation wRfx: NONREACTIVE

## 2019-05-01 LAB — APTT: aPTT: 29 seconds (ref 24–36)

## 2019-05-01 LAB — ETHANOL: Alcohol, Ethyl (B): <10 mg/dL (ref <10)

## 2019-05-01 LAB — ECHOCARDIOGRAM COMPLETE
Height: 67 in
Weight: 4102.4 oz

## 2019-05-01 LAB — TROPONIN I (HIGH SENSITIVITY)
Troponin I (High Sensitivity): 4 ng/L (ref <18)
Troponin I (High Sensitivity): 5 ng/L (ref <18)

## 2019-05-01 LAB — PROTIME-INR
INR: 0.9 (ref 0.8–1.2)
Prothrombin Time: 12.5 seconds (ref 11.4–15.2)

## 2019-05-01 LAB — RESPIRATORY PANEL BY RT PCR (FLU A&B, COVID)
Influenza A by PCR: NEGATIVE
Influenza B by PCR: NEGATIVE
SARS Coronavirus 2 by RT PCR: NEGATIVE

## 2019-05-01 MED ORDER — CHLORHEXIDINE GLUCONATE CLOTH 2 % EX PADS: 6.0000 | MEDICATED_PAD | Freq: Every day | CUTANEOUS | Status: DC

## 2019-05-01 MED ORDER — SODIUM CHLORIDE 0.9 % IV SOLN: INTRAVENOUS | Status: DC

## 2019-05-01 MED ORDER — PANTOPRAZOLE SODIUM 40 MG IV SOLR
40.0000 mg | Freq: Every day | INTRAVENOUS | Status: DC
  Administered 2019-05-01: 40 mg via INTRAVENOUS
  Filled 2019-05-01: qty 40

## 2019-05-01 MED ORDER — HYDRALAZINE HCL 20 MG/ML IJ SOLN
10.0000 mg | INTRAMUSCULAR | Status: DC | PRN
  Administered 2019-05-01: 10 mg via INTRAVENOUS
  Filled 2019-05-01: qty 1

## 2019-05-01 MED ORDER — PROMETHAZINE HCL 25 MG/ML IJ SOLN: 12.5000 mg | Freq: Once | INTRAMUSCULAR | Status: AC

## 2019-05-01 MED ORDER — CLEVIDIPINE BUTYRATE 0.5 MG/ML IV EMUL: 0.0000 mg/h | INTRAVENOUS | Status: DC

## 2019-05-01 MED ORDER — STROKE: EARLY STAGES OF RECOVERY BOOK: Freq: Once | Status: DC

## 2019-05-01 MED ORDER — LABETALOL HCL 5 MG/ML IV SOLN: 20.0000 mg | Freq: Once | INTRAVENOUS | Status: DC

## 2019-05-01 MED ORDER — TAMSULOSIN HCL 0.4 MG PO CAPS: 0.4000 mg | ORAL_CAPSULE | Freq: Every day | ORAL | Status: DC

## 2019-05-01 MED ORDER — IOHEXOL 350 MG/ML SOLN
100.0000 mL | Freq: Once | INTRAVENOUS | Status: AC | PRN
  Administered 2019-05-01: 100 mL via INTRAVENOUS

## 2019-05-01 MED ORDER — PROMETHAZINE HCL 25 MG/ML IJ SOLN
INTRAMUSCULAR | Status: AC
  Administered 2019-05-01: 12.5 mg via INTRAVENOUS
  Filled 2019-05-01: qty 1

## 2019-05-01 MED ORDER — IOHEXOL 350 MG/ML SOLN
50.0000 mL | Freq: Once | INTRAVENOUS | Status: AC | PRN
  Administered 2019-05-01: 50 mL via INTRAVENOUS

## 2019-05-01 MED ORDER — ACETAMINOPHEN 325 MG PO TABS: 650.0000 mg | ORAL_TABLET | ORAL | Status: DC | PRN

## 2019-05-01 MED ORDER — SODIUM CHLORIDE 0.9 % IV SOLN
50.0000 mL | Freq: Once | INTRAVENOUS | Status: AC
  Administered 2019-05-01: 50 mL via INTRAVENOUS

## 2019-05-01 MED ORDER — SENNOSIDES-DOCUSATE SODIUM 8.6-50 MG PO TABS: 1.0000 | ORAL_TABLET | Freq: Every evening | ORAL | Status: DC | PRN

## 2019-05-01 MED ORDER — ALTEPLASE (STROKE) FULL DOSE INFUSION
90.0000 mg | Freq: Once | INTRAVENOUS | Status: AC
  Administered 2019-05-01: 90 mg via INTRAVENOUS
  Filled 2019-05-01 (×2): qty 100

## 2019-05-01 MED ORDER — ACETAMINOPHEN 160 MG/5ML PO SOLN: 650.0000 mg | ORAL | Status: DC | PRN

## 2019-05-01 MED ORDER — ACETAMINOPHEN 650 MG RE SUPP: 650.0000 mg | RECTAL | Status: DC | PRN

## 2019-05-01 NOTE — ED Notes (Signed)
[facility] transporting now to [facility] hospital, report provided to transport team, TPA in progress.  Call to [facility] ER charge to provide report.

## 2019-05-01 NOTE — ED Notes (Signed)
Pt resting in bed. No distress noted. Pt still has left facial numbness. Pt conscious, breathing, and A&Ox4. Chest rise and fall equally with non-labored breathing. Will continue to monitor.

## 2019-05-01 NOTE — Consult Note (Addendum)
[facility] Tele[facility]urology Consult Services   Date of Service:   05/01/2019 09:58:21  Impression:     .  I63.9 - Cerebrovascular accident (CVA), unspecified mechanism ([place])  Comments/Sign-Out: Patient is unable to stand and has left sided numbness. Concern for a posterior circulation infarct. Patient does not have any contraindications to IV altepalse and wishes to proceed with treatment. Recommend admission for observation and further work up for stroke. CTA/P will require pre12medication due to allergy concern.  Metrics: Last Known Well: 05/01/2019 08:00:00 [facility] Notification Time: 05/01/2019 09:58:21 Arrival Time: 05/01/2019 08:57:00 Stamp Time: 05/01/2019 09:58:21 Time First Login Attempt: 05/01/2019 10:02:57 Symptoms: dizziness NIHSS Start Assessment Time: 05/01/2019 10:04:59 Alteplase Early Mix Decision Time: 05/01/2019 10:15:17 Patient is a candidate for Alteplase/Activase. Alteplase/Activase CPOE Order Time: 05/01/2019 10:17:24 Needle Time: 05/01/2019 10:34:06 Weight Noted by Staff: 116.3 kg Reason for Alteplase/Activase Delay: Delays related to tPA Administration  CT head showed no acute hemorrhage or acute core infarct.  Lower Likelihood of Large Vessel Occlusion but Following Stat Studies are Recommended  CTA Head and Neck. CT Perfusion.   ED Physician notified of diagnostic impression and management plan on 05/01/2019 10:20:39  Alteplase/Activase Contraindications:  Last Known Well > 4.5 hours: No CT Head showing hemorrhage: No Ischemic stroke within 3 months: No Severe head trauma within 3 months: No Intracranial/intraspinal surgery within 3 months: No History of intracranial hemorrhage: No Symptoms and signs consistent with an SAH: No GI malignancy or GI bleed within 21 days: No Coagulopathy: Platelets <100 000/mm3, INR >1.7, aPTT>40 s, or PT >15 s: No Treatment dose of LMWH within the previous 24 hrs: No Use of NOACs in past  48 hours: No Glycoprotein IIb/IIIa receptor inhibitors use: No Symptoms consistent with infective endocarditis: No Suspected aortic arch dissection: No Intra-axial intracranial neoplasm: No  Verbal Consent to Alteplase/Activase: I have explained to the Patient the nature of the patient's condition, reviewed the indications and contraindications to the use of Alteplase/Activase fibrinolytic agent, reviewed the indications and contraindications and the benefits to be reasonably expected compared with alternative approaches. I have discussed the likelihood of major risks or complications of this procedure including (if applicable) but not limited to loss of limb function, brain damage, paralysis, hemorrhage, infection, complications from transfusion of blood components, drug reactions, blood clots and loss of life. I have also indicated that with any procedure there is always the possibility of an unexpected complication. All questions were answered and Patient express understanding of the treatment plan and consent to the treatment.  Our recommendations are outlined below.  Recommendations: IV Alteplase/Activase recommended.  Alteplase/Activase bolus given Without Complication.   IV Alteplase/Activase Total Dose - 90.0 mg IV Alteplase/Activase Bolus Dose - 9.0 mg IV Alteplase/Activase Infusion Dose - 81.0 mg  Routine post Alteplase/Activase monitoring including neuro checks and blood pressure control during/after treatment Monitor blood pressure Check blood pressure and NIHSS every 15 min for 2 h, then every 30 min for 6 h, and finally every hour for 16 h.  Manage Blood Pressure per post Alteplase/Activase protocol.      .  Admission to ICU     .  CT brain 24 hours post Alteplase/Activase     .  NPO until swallowing screen performed and passed     .  No antiplatelet agents or anticoagulants (including heparin for DVT prophylaxis) in first 24 hours     .  No Foley catheter, nasogastric  tube, arterial catheter or central venous catheter for 24 hr, unless absolutely necessary     .  Telemetry     .  Bedside swallow evaluation     .  HOB less than 30 degrees     .  Euglycemia     .  Avoid hyperthermia, PRN acetaminophen     .  DVT prophylaxis     .  Inpatient Neurology Consultation     .  Stroke evaluation as per inpatient neurology recommendations  Discussed with ED physician    ------------------------------------------------------------------------------  History of Present Illness: Patient is a 54 year old Male.  Patient was brought by private transportation with symptoms of dizziness  who is presenting with dizziness. He woke up normal this morning and went to work were he developed dizziness, vomiting and generalized weakness. Diaphoretic. It occurs mostly with movement. The dizziness resolves with staying still and closing his eyes. Frontal headache that is 4-5/10.   Past Medical History:     . There is NO history of Hypertension     . There is NO history of Diabetes Mellitus     . There is NO history of Hyperlipidemia     . There is NO history of Stroke  Anticoagulant use:  No  Antiplatelet use: No   Examination: BP(141/78), Pulse(70), Blood Glucose(116) 1A: Level of Consciousness - Alert; keenly responsive + 0 1B: Ask Month and Age - Both Questions Right + 0 1C: Blink Eyes & Squeeze Hands - Performs Both Tasks + 0 2: Test Horizontal Extraocular Movements - Normal + 0 3: Test Visual Fields - No Visual Loss + 0 4: Test Facial Palsy (Use Grimace if Obtunded) - Normal symmetry + 0 5A: Test Left Arm Motor Drift - No Drift for 10 Seconds + 0 5B: Test Right Arm Motor Drift - No Drift for 10 Seconds + 0 6A: Test Left Leg Motor Drift - No Drift for 5 Seconds + 0 6B: Test Right Leg Motor Drift - No Drift for 5 Seconds + 0 7: Test Limb Ataxia (FNF/Heel-Shin) - No Ataxia + 0 8: Test Sensation - Mild-Moderate Loss: Can Sense Being Touched + 1 9: Test  Language/Aphasia - Normal; No aphasia + 0 10: Test Dysarthria - Normal + 0 11: Test Extinction/Inattention - No abnormality + 0  NIHSS Score: 1  Pre-Morbid Modified Ranking Scale: 0 Points = No symptoms at all   Patient/Family was informed the Neurology Consult would happen via TeleHealth consult by way of interactive audio and video telecommunications and consented to receiving care in this manner.   Due to the immediate potential for life-threatening deterioration due to underlying acute neurologic illness, I spent 60 minutes providing critical care. This time includes time for face to face visit via telemedicine, review of medical records, imaging studies and discussion of findings with providers, the patient and/or family.   Dr Carolin Sicks   [facility] [phone]   Case [id number]  Addendum: CTA reviewed and no proximal large vessel occlusion seen. Continue with post tpa care.  Carolin Sicks, DO [facility]

## 2019-05-01 NOTE — ED Notes (Signed)
RN to RN report given to [facility] on 4N ICU.

## 2019-05-01 NOTE — ED Provider Notes (Addendum)
  Physical Exam  BP (!) 153/91   Pulse 93   Temp 98 F (36.7 C)   Resp 17   Ht 5\' 7"  (1.702 m)   Wt 116.3 kg   SpO2 98%   BMI 40.16 kg/m   Physical Exam  ED Course/Procedures     .Critical Care Performed by: Varney Biles, MD Authorized by: Varney Biles, MD   Critical care provider statement:    Critical care time (minutes):  30   Critical care was necessary to treat or prevent imminent or life-threatening deterioration of the following conditions:  CNS failure or compromise   Critical care was time spent personally by me on the following activities:  Discussions with consultants, evaluation of patient's response to treatment, examination of patient, ordering and performing treatments and interventions, ordering and review of laboratory studies, ordering and review of radiographic studies, pulse oximetry, re-evaluation of patient's condition, obtaining history from patient or surrogate and review of old charts    MDM    Patient transferred from [place] emergency room for stroke work-up. Patient has no medical history but reports having left-sided numbness to his face and having dizziness.  Dizziness is constant and described a spinning sensation that is not severe.  Symptoms are not worse with any movement.  He was started on TPA and has completed the bolus.  Vital signs show normal hemodynamics.  On our exam there is no focal weakness but the cerebellar exam reveals mild dysmetria with left finger-to-nose + saccadic eye movement with lateral gaze.  He also has subjective numbness to the face.  Neurology team has been consulted.  No evidence of LVO. CT-A reassuring. CT head neg.  Labs reviewed and reassuring.  Continue with admission.    Varney Biles, MD 05/01/19 [phone]

## 2019-05-01 NOTE — ED Notes (Signed)
Dr Wilson Singer at bedside calling code stroke at this time

## 2019-05-01 NOTE — ED Notes (Signed)
[facility] at bedside loading patient awaiting OK to transport awaiting CTA result for bed placement

## 2019-05-01 NOTE — ED Notes (Signed)
Pt reports left sided chest pain after initial tpa bolus, tele neuro still online during event, repeat b/p and ekg obtained and provided to Dr Wilson Singer

## 2019-05-01 NOTE — ED Notes (Signed)
Confirmed with Dr Wilson Singer to do CTA only, not perfusion study prior to transport

## 2019-05-01 NOTE — ED Notes (Signed)
Pt care endorsed from Sarah RN.  

## 2019-05-01 NOTE — ED Provider Notes (Signed)
[place] EMERGENCY DEPARTMENT Provider Note   CSN: [id number] Arrival date & time: 05/01/19  0857     History Chief Complaint  Patient presents with  . Dizziness  . Emesis    Bradley Shaw is a 54 y.o. male.  HPI   54 year old male with dizziness nausea/vomiting.  Onset about an hour prior to arrival.  He woke up this morning in his usual state of health.  He went to work and was standing when he began to feel that the room was spinning.  Says he began feeling nauseated and vomited several times.  Diaphoretic.  Since that time he has continued to have this room spinning sensation, feel nauseated and arrived to the emergency room dry heaving and diaphoretic.  Denies any headaches or acute pain elsewhere.  Mild blurred vision bilaterally.  Denies double vision.  Past history of prostate cancer.  Past Medical History:  Diagnosis Date  . Prostate cancer [facility])     Patient Active Problem List   Diagnosis Date Noted  . Prostate Cancer  (Large Volume Moderate Risk) 05/01/2016  . Elevated PSA 04/23/2016  . Erectile dysfunction due to arterial insufficiency 04/23/2016    Past Surgical History:  Procedure Laterality Date  . HIP SURGERY Right 2015  . JOINT REPLACEMENT Right    hip  . PROSTATE BIOPSY    . RADIOACTIVE SEED IMPLANT N/A 08/20/2016   Procedure: RADIOACTIVE SEED IMPLANT/BRACHYTHERAPY IMPLANT;  Surgeon: Hollice Espy, MD;  Location: [facility] ORS;  Service: Urology;  Laterality: N/A;  . TONSILLECTOMY        Family History  Problem Relation Age of Onset  . Prostate cancer Neg Hx   . Bladder Cancer Neg Hx   . Kidney cancer Neg Hx     Social History   Tobacco Use  . Smoking status: Current Every Day Smoker    Packs/day: 0.25    Types: Cigarettes  . Smokeless tobacco: Never Used  Substance Use Topics  . Alcohol use: Yes    Comment: beer occassional  . Drug use: Yes    Types: Marijuana    Home Medications Prior to Admission medications     Medication Sig Start Date End Date Taking? Authorizing Provider  Hyprom-Naphaz-Polysorb-Zn Sulf (CLEAR EYES COMPLETE OP) Place 1 drop into both eyes 3 (three) times daily as needed (dry eyes).    [provider]  ibuprofen (ADVIL,MOTRIN) 200 MG tablet Take 400 mg by mouth 2 (two) times daily as needed for moderate pain.    [provider]  sildenafil (REVATIO) 20 MG tablet May use 1-5 tabs daily 1/2 hr before intercourse 07/26/17   Noreene Filbert, MD  tamsulosin (FLOMAX) 0.4 MG CAPS capsule TAKE 1 CAPSULE (0.4 MG TOTAL) BY MOUTH DAILY AFTER SUPPER. 12/20/16   Noreene Filbert, MD  trolamine salicylate (ASPERCREME) 10 % cream Apply 1 application topically 2 (two) times daily as needed for muscle pain.    [provider]    Allergies    Shellfish allergy  Review of Systems   Review of Systems All systems reviewed and negative, other than as noted in HPI.  Physical Exam Updated Vital Signs Pulse 75   Temp 97.9 F (36.6 C) (Oral)   Resp 20   Ht 5\' 7"  (1.702 m)   Wt 118.8 kg   SpO2 98%   BMI 41.04 kg/m   Physical Exam Vitals and nursing note reviewed.  Constitutional:      General: He is not in acute distress.  Appearance: He is well-developed.  HENT:     Head: Normocephalic and atraumatic.  Eyes:     General:        Right eye: No discharge.        Left eye: No discharge.     Conjunctiva/sclera: Conjunctivae normal.  Cardiovascular:     Rate and Rhythm: Normal rate and regular rhythm.     Heart sounds: Normal heart sounds. No murmur. No friction rub. No gallop.   Pulmonary:     Effort: Pulmonary effort is normal. No respiratory distress.     Breath sounds: Normal breath sounds.  Abdominal:     General: There is no distension.     Palpations: Abdomen is soft.     Tenderness: There is no abdominal tenderness.  Musculoskeletal:        General: No tenderness.     Cervical back: Neck supple.  Skin:    General: Skin is warm and dry.   Neurological:     Mental Status: He is alert.     Comments: Speech clear.  Content appropriate.  Cranial nerves II through XII intact.  Horizontal nystagmus.  Good finger-nose testing on the right.  He does appear to have some dysmetria on the left.  Strength is 5 out of 5 bilateral upper and lower extremities.  Psychiatric:        Behavior: Behavior normal.        Thought Content: Thought content normal.     ED Results / Procedures / Treatments   Labs (all labs ordered are listed, but only abnormal results are displayed) Labs Reviewed  COMPREHENSIVE METABOLIC PANEL - Abnormal; Notable for the following components:      Result Value   Glucose, Bld 157 (*)    Calcium 8.7 (*)    All other components within normal limits  URINALYSIS, ROUTINE W REFLEX MICROSCOPIC - Abnormal; Notable for the following components:   Specific Gravity, Urine >1.046 (*)    Hgb urine dipstick SMALL (*)    Protein, ur 30 (*)    All other components within normal limits  RAPID URINE DRUG SCREEN, HOSP PERFORMED - Abnormal; Notable for the following components:   Tetrahydrocannabinol POSITIVE (*)    All other components within normal limits  CBG MONITORING, ED - Abnormal; Notable for the following components:   Glucose-Capillary 116 (*)    All other components within normal limits  RESPIRATORY PANEL BY RT PCR (FLU A&B, COVID)  SARS CORONAVIRUS 2 (TAT 6-24 HRS)  CBC WITH DIFFERENTIAL/PLATELET  ETHANOL  PROTIME-INR  APTT  HIV ANTIBODY (ROUTINE TESTING W REFLEX)  RAPID URINE DRUG SCREEN, HOSP PERFORMED  URINALYSIS, ROUTINE W REFLEX MICROSCOPIC  TROPONIN I (HIGH SENSITIVITY)  TROPONIN I (HIGH SENSITIVITY)    EKG EKG Interpretation  Date/Time:  Friday May 01 2019 09:22:50 EST Ventricular Rate:  74 PR Interval:    QRS Duration: 86 QT Interval:  405 QTC Calculation: 450 R Axis:   -11 Text Interpretation: Sinus rhythm Consider left atrial enlargement RSR' in V1 or V2, right VCD or RVH Confirmed by  Bradley Shaw [phone]) on 05/01/2019 9:55:46 AM   Radiology CT Angio Head W or Wo Contrast  Addendum Date: 05/01/2019   ADDENDUM REPORT: 05/01/2019 11:57 ADDENDUM: Fractures of the medial orbital wall bilaterally likely chronic. No prior studies for comparison. Electronically Signed   By: Franchot Gallo M.D.   On: 05/01/2019 11:57   Result Date: 05/01/2019 CLINICAL DATA:  Vertigo.  Vomiting. EXAM: CT ANGIOGRAPHY HEAD AND NECK  TECHNIQUE: Multidetector CT imaging of the head and neck was performed using the standard protocol during bolus administration of intravenous contrast. Multiplanar CT image reconstructions and MIPs were obtained to evaluate the vascular anatomy. Carotid stenosis measurements (when applicable) are obtained utilizing NASCET criteria, using the distal internal carotid diameter as the denominator. CONTRAST:  168mL OMNIPAQUE IOHEXOL 350 MG/ML SOLN COMPARISON:  CT head 05/01/2019 FINDINGS: CT HEAD FINDINGS Brain: No evidence of acute infarction, hemorrhage, hydrocephalus, extra-axial collection or mass lesion/mass effect. Mild hypodensity in the frontal white matter bilaterally likely chronic. Vascular: Negative for hyperdense vessel Skull: Negative Sinuses: Negative Orbits: Negative Review of the MIP images confirms the above findings CTA NECK FINDINGS Aortic arch: Standard branching. Imaged portion shows no evidence of aneurysm or dissection. No significant stenosis of the major arch vessel origins. Right carotid system: Mild atherosclerotic disease right carotid bifurcation without significant right carotid stenosis. Tortuosity distal right internal carotid artery. Left carotid system: Mild atherosclerotic disease left carotid bifurcation without significant stenosis. Tortuosity distal left internal carotid artery. Vertebral arteries: Both vertebral arteries patent to the basilar without significant stenosis. Skeleton: Multiple dental caries. Mild degenerative change cervical spine without  acute abnormality. Other neck: Negative for mass or adenopathy in the neck. Upper chest: Lung apices clear bilaterally. Review of the MIP images confirms the above findings CTA HEAD FINDINGS Anterior circulation: Suboptimal intracranial opacification due to patient size and scan timing. Atherosclerotic calcification in the cavernous carotid bilaterally anterior and middle cerebral arteries patent bilaterally. Posterior circulation: Both vertebral arteries patent to the basilar. Small right PE ICA. Left PICA not visualized. Basilar patent. Superior cerebellar and posterior cerebral arteries patent bilaterally. Fetal origin left posterior cerebral artery. Venous sinuses: Normal venous enhancement Anatomic variants: None Review of the MIP images confirms the above findings IMPRESSION: 1. Suboptimal arterial enhancement due to patient size and scan timing. 2. Mild atherosclerotic disease of the carotid bifurcation bilaterally without stenosis. Both vertebral arteries widely patent 3. No acute intracranial large vessel occlusion. 4. These results were called by telephone at the time of interpretation on 05/01/2019 at 11:24 am to provider Lindzen , who verbally acknowledged these results. Electronically Signed: By: Franchot Gallo M.D. On: 05/01/2019 11:24   CT Angio Neck W and/or Wo Contrast  Addendum Date: 05/01/2019   ADDENDUM REPORT: 05/01/2019 11:57 ADDENDUM: Fractures of the medial orbital wall bilaterally likely chronic. No prior studies for comparison. Electronically Signed   By: Franchot Gallo M.D.   On: 05/01/2019 11:57   Result Date: 05/01/2019 CLINICAL DATA:  Vertigo.  Vomiting. EXAM: CT ANGIOGRAPHY HEAD AND NECK TECHNIQUE: Multidetector CT imaging of the head and neck was performed using the standard protocol during bolus administration of intravenous contrast. Multiplanar CT image reconstructions and MIPs were obtained to evaluate the vascular anatomy. Carotid stenosis measurements (when applicable) are  obtained utilizing NASCET criteria, using the distal internal carotid diameter as the denominator. CONTRAST:  148mL OMNIPAQUE IOHEXOL 350 MG/ML SOLN COMPARISON:  CT head 05/01/2019 FINDINGS: CT HEAD FINDINGS Brain: No evidence of acute infarction, hemorrhage, hydrocephalus, extra-axial collection or mass lesion/mass effect. Mild hypodensity in the frontal white matter bilaterally likely chronic. Vascular: Negative for hyperdense vessel Skull: Negative Sinuses: Negative Orbits: Negative Review of the MIP images confirms the above findings CTA NECK FINDINGS Aortic arch: Standard branching. Imaged portion shows no evidence of aneurysm or dissection. No significant stenosis of the major arch vessel origins. Right carotid system: Mild atherosclerotic disease right carotid bifurcation without significant right carotid stenosis. Tortuosity distal right internal  carotid artery. Left carotid system: Mild atherosclerotic disease left carotid bifurcation without significant stenosis. Tortuosity distal left internal carotid artery. Vertebral arteries: Both vertebral arteries patent to the basilar without significant stenosis. Skeleton: Multiple dental caries. Mild degenerative change cervical spine without acute abnormality. Other neck: Negative for mass or adenopathy in the neck. Upper chest: Lung apices clear bilaterally. Review of the MIP images confirms the above findings CTA HEAD FINDINGS Anterior circulation: Suboptimal intracranial opacification due to patient size and scan timing. Atherosclerotic calcification in the cavernous carotid bilaterally anterior and middle cerebral arteries patent bilaterally. Posterior circulation: Both vertebral arteries patent to the basilar. Small right PE ICA. Left PICA not visualized. Basilar patent. Superior cerebellar and posterior cerebral arteries patent bilaterally. Fetal origin left posterior cerebral artery. Venous sinuses: Normal venous enhancement Anatomic variants: None Review  of the MIP images confirms the above findings IMPRESSION: 1. Suboptimal arterial enhancement due to patient size and scan timing. 2. Mild atherosclerotic disease of the carotid bifurcation bilaterally without stenosis. Both vertebral arteries widely patent 3. No acute intracranial large vessel occlusion. 4. These results were called by telephone at the time of interpretation on 05/01/2019 at 11:24 am to provider Lindzen , who verbally acknowledged these results. Electronically Signed: By: Franchot Gallo M.D. On: 05/01/2019 11:24   CT CEREBRAL PERFUSION W CONTRAST  Result Date: 05/01/2019 CLINICAL DATA:  Neuro deficit, acute, stroke suspected. 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 Patient arrived actively vomiting, diaphoretic. EXAM: CT PERFUSION BRAIN TECHNIQUE: Multiphase CT imaging of the brain was performed following IV bolus contrast injection. Subsequent parametric perfusion maps were calculated using RAPID software. CONTRAST:  64mL OMNIPAQUE IOHEXOL 350 MG/ML SOLN COMPARISON:  Noncontrast head CT and CT angiogram head/neck 05/01/2019 FINDINGS: CT Brain Perfusion Findings: CBF (<30%) Volume: 0 mL Perfusion (Tmax>6.0s) volume: 0 mL Mismatch Volume: 0 mL Infarct Core: 0 mL Infarction Location: None identified IMPRESSION: No core infarct is identified by the perfusion software. Additionally, the perfusion software detects no region of critically hypoperfused parenchyma utilizing a Tmax>6 seconds threshold. Electronically Signed   By: Kellie Simmering DO   On: 05/01/2019 13:11   CT HEAD CODE STROKE WO CONTRAST  Result Date: 05/01/2019 CLINICAL DATA:  Code stroke. Nausea and vomiting for 2 hours with vertigo EXAM: CT HEAD WITHOUT CONTRAST TECHNIQUE: Contiguous axial images were obtained from the base of the skull through the vertex without intravenous contrast. COMPARISON:  None. FINDINGS: Brain: No evidence of acute infarction,  hemorrhage, hydrocephalus, extra-axial collection or mass lesion/mass effect. Vascular: No hyperdense vessel or unexpected calcification. Skull: Negative Sinuses/Orbits: Nonspecific gaze to the left. No mastoid or middle ear opacification. Remote medial orbit blowout fractures on the left more than right. Other: These results were called by telephone at the time of interpretation on 05/01/2019 at 10:05 am to provider [facility] , who verbally acknowledged these results. ASPECTS [facility] Stroke Program Early CT Score) Not scored with this history IMPRESSION: Negative CT of the brain. Electronically Signed   By: Monte Fantasia M.D.   On: 05/01/2019 10:06    Procedures Procedures (including critical care time)  CRITICAL CARE Performed by: Bradley Shaw Total critical care time: 35 minutes Critical care time was exclusive of separately billable procedures and treating other patients. Critical care was necessary to treat or prevent imminent or life-threatening deterioration. Critical care was time spent personally by me on the following activities: development of treatment plan with patient and/or surrogate as  well as nursing, discussions with consultants, evaluation of patient's response to treatment, examination of patient, obtaining history from patient or surrogate, ordering and performing treatments and interventions, ordering and review of laboratory studies, ordering and review of radiographic studies, pulse oximetry and re-evaluation of patient's condition.   Medications Ordered in ED Medications  0.9 %  sodium chloride infusion (has no administration in time range)  promethazine (PHENERGAN) injection 12.5 mg (has no administration in time range)    ED Course  I have reviewed the triage vital signs and the nursing notes.  Pertinent labs & imaging results that were available during my care of the patient were reviewed by me and considered in my medical decision making (see chart for  details).    MDM Rules/Calculators/A&P                      54 year old male with vertigo.  Concern for possible central etiology.  Symptoms and exam not clearly consistent with simple peripheral cause.  He reports continued vertigo even while at rest.  Blurred vision.  He denies diplopia though.  No headaches.  Questionable dysmetria with finger-nose testing on the left. Code Stroke activated. Last normal 0800 today.   10:23 AM Evaluated by teleneurology. Also concerned about posterior circulation stroke. Recommending tpa. Discussed with Dr Francia Greaves, [place]. Will discuss with on-ca  Final Clinical Impression(s) / ED Diagnoses Final diagnoses:  Vertigo, central    Rx / DC Orders ED Discharge Orders    None       Bradley Manifold, MD 05/01/19 1455

## 2019-05-01 NOTE — ED Notes (Signed)
Teleneuro eval in process ?

## 2019-05-01 NOTE — ED Notes (Signed)
trasported to ct with pt on monitor with tpa infusion in progress

## 2019-05-01 NOTE — Progress Notes (Signed)
  Echocardiogram 2D Echocardiogram has been performed.  Bradley Shaw 05/01/2019, 1:58 PM

## 2019-05-01 NOTE — ED Notes (Signed)
Pt remains alert, oriented, follows commands, answers all questions appropriately.  MD aware of shellfish allergy, pt has not had contrast scans in past, to be monitored closely during contrast infusion

## 2019-05-01 NOTE — ED Notes (Signed)
Pt requesting food/drink

## 2019-05-01 NOTE — H&P (Addendum)
NEURO HOSPITALIST H&P   Requestig physician: Dr. Kathrynn Humble  Reason for Consult: Stroke s/p tPA  History obtained from:  Patient     HPI:                                                                                                                                          Bradley Shaw is a 54 y.o. male with a PMHx of prostate cancer, presenting in transfer to [facility] from OSH for further stroke management. He presented initially to [facility] with dizziness, diaphoresis and recurrent vomiting. Exam by EDP there showed dysmetria on the left. Code Stroke was initiated and Teleneurologist recommended IV tPA after CT head showed no hemorrhage or acute hypodensity.   Per patient he woke up this morning at 0300 and was his usual self. He proceeded to drive himself to work. At about 0800 he was outside trying to smoke a cigarette and had a sudden onset of dizziness (room spinning sensation) and began dry heaving. He sat down and this feeling passed. He then went back to work and had a second episode of sudden-onset dizziness, but this time he became diaphoretic, with dry heaving that progressed to vomiting and he had diarrhea as well. He was then driven to the [facility] ED by his boss.   After tPA was started at [facility], a CTA of head and neck was ordered, revealing no LVO. There was mild atherosclerotic disease of the carotid bifurcations bilaterally without stenosis. Both vertebral arteries were widely patent. CT perfusion has also been obtained, with no core infarct identified by the perfusion software. Additionally, the perfusion software detects no region of critically hypoperfused parenchyma utilizing a Tmax>6 seconds threshold.  He denies any CP, weakness, tingling or numbness.  He does take Aleve daily for arthritis. Endorses smoking 1.5 pack of cigarettes per day, occasional marijuana usage, and he drinks 1 shot of liquor and 3 beers per day. No prior stroke history.  Initial NIHSS: 1 for  decreased sensation tPA was given at OSH  NIHSS on assessment at [facility]: 1; decreased sensation on left side.   Past Medical History:  Diagnosis Date  . Prostate cancer [facility])     Past Surgical History:  Procedure Laterality Date  . HIP SURGERY Right 2015  . JOINT REPLACEMENT Right    hip  . PROSTATE BIOPSY    . RADIOACTIVE SEED IMPLANT N/A 08/20/2016   Procedure: RADIOACTIVE SEED IMPLANT/BRACHYTHERAPY IMPLANT;  Surgeon: Hollice Espy, MD;  Location: [facility] ORS;  Service: Urology;  Laterality: N/A;  . TONSILLECTOMY      Family History  Problem Relation Age of Onset  . Prostate cancer Neg Hx   . Bladder Cancer Neg Hx   . Kidney cancer Neg Hx  Social History:  reports that he has been smoking cigarettes. He has been smoking about 0.25 packs per day. He has never used smokeless tobacco. He reports current alcohol use. He reports current drug use. Drug: Marijuana.  Allergies  Allergen Reactions  . Shellfish Allergy Anaphylaxis    Hives & throat closes    MEDICATIONS:                                                                                                                     Current Facility-Administered Medications  Medication Dose Route Frequency Provider Last Rate Last Admin  . 0.9 %  sodium chloride infusion   Intravenous Continuous Virgel Manifold, MD   Stopped at 05/01/19 1116   Current Outpatient Medications  Medication Sig Dispense Refill  . Hyprom-Naphaz-Polysorb-Zn Sulf (CLEAR EYES COMPLETE OP) Place 1 drop into both eyes 3 (three) times daily as needed (dry eyes).    Marland Kitchen ibuprofen (ADVIL,MOTRIN) 200 MG tablet Take 400 mg by mouth 2 (two) times daily as needed for moderate pain.    . sildenafil (REVATIO) 20 MG tablet May use 1-5 tabs daily 1/2 hr before intercourse 20 tablet 0  . tamsulosin (FLOMAX) 0.4 MG CAPS capsule TAKE 1 CAPSULE (0.4 MG TOTAL) BY MOUTH DAILY AFTER SUPPER. 30 capsule 11  . trolamine salicylate (ASPERCREME) 10 % cream Apply 1  application topically 2 (two) times daily as needed for muscle pain.      ROS:                                                                                                                                       ROS was performed and is negative except as noted in HPI  Blood pressure 138/77, pulse 72, temperature 97.9 F (36.6 C), temperature source Oral, resp. rate 16, height 5\' 7"  (1.702 m), weight 116.3 kg, SpO2 96 %.   General Examination:  Physical Exam  HEENT-  Normocephalic, no lesions, without obvious abnormality.  Normal external eye and conjunctiva. Missing teeth  Cardiovascular- S1-S2 audible, pulses palpable throughout   Lungs-no rhonchi or wheezing noted, no excessive working breathing.  Saturations within normal limits Abdomen- All 4 quadrants palpated and nontender Extremities- Warm, dry and intact Musculoskeletal-no joint tenderness, deformity or swelling Skin-warm and dry, no hyperpigmentation, vitiligo, or suspicious lesions  Neurological Examination Mental Status: Alert, oriented, thought content appropriate.  Speech fluent without evidence of aphasia.  Able to follow all commands without difficulty. Cranial Nerves: II:  Visual fields grossly normal, PERRL.  III,IV, VI: Ptosis not present, EOMI V,VII: Smile symmetric, facial light touch sensation decreased on the left side VIII: Hearing intact to conversation IX,X: Palate rises symmetrically XI: Symmetric shoulder shrug XII: Midline tongue extension Motor: Right : Upper extremity   5/5  Left:     Upper extremity   5/5  Lower extremity   5/5   Lower extremity   5/5 Tone and bulk:normal tone throughout; no atrophy noted No pronator drift Sensory:  Light touch decreased on left side. No extinction to DSS.  Deep Tendon Reflexes: 2+ and symmetric throughout Plantars: Right: downgoing  Left: downgoing Cerebellar:  No ataxia noted with FNF. Some dysdiadochokinesia noted with RAM bilaterally, worse on the left. H-S with mild dyssinergia and bradykinesia on the left.  Gait: Deferred   Lab Results: Basic Metabolic Panel: Recent Labs  Lab 05/01/19 0941  NA 138  K 3.7  CL 106  CO2 22  GLUCOSE 157*  BUN 18  CREATININE 0.85  CALCIUM 8.7*    CBC: Recent Labs  Lab 05/01/19 0941  WBC 5.3  NEUTROABS 3.5  HGB 14.7  HCT 44.0  MCV 93.6  PLT 208     Imaging: CT HEAD CODE STROKE WO CONTRAST  Result Date: 05/01/2019 CLINICAL DATA:  Code stroke. Nausea and vomiting for 2 hours with vertigo EXAM: CT HEAD WITHOUT CONTRAST TECHNIQUE: Contiguous axial images were obtained from the base of the skull through the vertex without intravenous contrast. COMPARISON:  None. FINDINGS: Brain: No evidence of acute infarction, hemorrhage, hydrocephalus, extra-axial collection or mass lesion/mass effect. Vascular: No hyperdense vessel or unexpected calcification. Skull: Negative Sinuses/Orbits: Nonspecific gaze to the left. No mastoid or middle ear opacification. Remote medial orbit blowout fractures on the left more than right. Other: These results were called by telephone at the time of interpretation on 05/01/2019 at 10:05 am to provider [facility] , who verbally acknowledged these results. ASPECTS [facility] Stroke Program Early CT Score) Not scored with this history IMPRESSION: Negative CT of the brain. Electronically Signed   By: Monte Fantasia M.D.   On: 05/01/2019 10:06   Laurey Morale, MSN, NP-C [facility] [phone]  Assessment: 54 year old male presenting initially to [facility] with dizziness, diaphoresis and recurrent vomiting. Exam by EDP there showed dysmetria on the left. Code Stroke was initiated and Teleneurologist recommended IV tPA after CT head showed no hemorrhage or acute hypodensity.  1. Exam on arrival to [facility] reveals left side with decreased sensation and left hemiataxia. 2. CTA head  and neck at [facility] revealed no acute LVO; mild atherosclerotic disease of the carotid bifurcation.  3. CTP: No core infarct or hypoperfusion  3. MRI brain: No acute infarction. Mild chronic small vessel ischemic changes are noted.  4. Stroke risk factors: Smoking and history of cancer 5. Marrow signal abnormalities on incidentally imaged portion of the rostral cervical spine, per MRI brain report.  Plan: 1. Admitting to Neuro ICU.  2. Post-tPA order set to include frequent neuro checks and BP management.  3. No antiplatelet medications or anticoagulants for at least 24 hours following tPA.  4. DVT prophylaxis with SCDs.  5. Will need to be started on a statin. Obtaining baseline CK level.  6. Will need to be started on antiplatelet therapy if follow up CT at 24 hours is negative for hemorrhagic conversion. 7. TTE.  8. Telemetry monitoring 9. PT/OT/Speech.  10. NPO until passes swallow evaluation.  11. Fasting lipid panel, HgbA1c 12. Further evaluation of the marrow signal abnormalities seen within the rostral cervical spine on MRI brain.  12. Smoking cessation counseling provided. Also should keep a BP diary from now on. The patient expressed understanding and agreement with this plan.    I have seen and examined the patient. I have formulated the assessment and recommendations. 54 year old male presenting from OSH after tPA administration for possible stroke. MRI brain here shows no acute infarction, demonstrating possible efficacy of tPA. Exam reveals mild left hemiataxia and left sided sensory loss. Diagnostic and treatment plan as above.  Electronically signed: Dr. Kerney Elbe  05/01/2019, 10:28 AM

## 2019-05-01 NOTE — ED Notes (Signed)
Pt transported to 4N-ICU via cart by this RN on continuous blood pressure, pulse ox, and cardiac monitor. No distress noted. Pt conscious, breathing, and A&Ox4. Pt care endorsed to [facility].

## 2019-05-01 NOTE — ED Notes (Signed)
Patient transported to CT 

## 2019-05-01 NOTE — ED Triage Notes (Signed)
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 Arrives actively vomiting, diaphoretic.  Takes alleve for arthritis.  Denies medical problems.  Hx prostate CA 2018

## 2019-05-01 NOTE — ED Notes (Signed)
Swallow screen deferred at this time due to nausea/vomiting, inability to tolerate po

## 2019-05-01 NOTE — ED Notes (Signed)
Report provided to [facility] RN charge, by RN and MD.

## 2019-05-01 NOTE — ED Notes (Signed)
Patient transported to MRI 

## 2019-05-01 NOTE — ED Notes (Signed)
ED Provider at bedside. 

## 2019-05-01 NOTE — ED Notes (Signed)
Pt on monitor 

## 2019-05-02 ENCOUNTER — Inpatient Hospital Stay (HOSPITAL_COMMUNITY): Payer: Managed Care, Other (non HMO)

## 2019-05-02 DIAGNOSIS — I6300 Cerebral infarction due to thrombosis of unspecified precerebral artery: Secondary | ICD-10-CM

## 2019-05-02 LAB — LIPID PANEL
Cholesterol: 200 mg/dL (ref 0–200)
HDL: 56 mg/dL (ref >40)
LDL Cholesterol: 127 mg/dL — ABNORMAL HIGH (ref 0–99)
Total CHOL/HDL Ratio: 3.6 RATIO
Triglycerides: 87 mg/dL (ref <150)
VLDL: 17 mg/dL (ref 0–40)

## 2019-05-02 LAB — GLUCOSE, CAPILLARY
Glucose-Capillary: 152 mg/dL — ABNORMAL HIGH (ref 70–99)
Glucose-Capillary: 91 mg/dL (ref 70–99)

## 2019-05-02 LAB — HEMOGLOBIN A1C
Hgb A1c MFr Bld: 5.7 % — ABNORMAL HIGH (ref 4.8–5.6)
Mean Plasma Glucose: 116.89 mg/dL

## 2019-05-02 MED ORDER — CLOPIDOGREL BISULFATE 75 MG PO TABS: 75.0000 mg | ORAL_TABLET | Freq: Every day | ORAL | 0 refills | Status: AC

## 2019-05-02 MED ORDER — ASPIRIN 81 MG PO TBEC: 81.0000 mg | DELAYED_RELEASE_TABLET | Freq: Every day | ORAL | Status: DC

## 2019-05-02 MED ORDER — ASPIRIN EC 81 MG PO TBEC
81.0000 mg | DELAYED_RELEASE_TABLET | Freq: Every day | ORAL | Status: DC
  Administered 2019-05-02: 81 mg via ORAL
  Filled 2019-05-02: qty 1

## 2019-05-02 MED ORDER — ATORVASTATIN CALCIUM 80 MG PO TABS: 80.0000 mg | ORAL_TABLET | Freq: Every day | ORAL | Status: DC

## 2019-05-02 MED ORDER — ATORVASTATIN CALCIUM 80 MG PO TABS: 80.0000 mg | ORAL_TABLET | Freq: Every day | ORAL | 3 refills | Status: DC

## 2019-05-02 MED ORDER — CLOPIDOGREL BISULFATE 75 MG PO TABS
75.0000 mg | ORAL_TABLET | Freq: Every day | ORAL | Status: DC
  Administered 2019-05-02: 75 mg via ORAL
  Filled 2019-05-02: qty 1

## 2019-05-02 NOTE — Discharge Summary (Signed)
Patient ID: Bradley Shaw   MRN: [id number]      DOB: 09-03-1965  Date of Admission: 05/01/2019 Date of Discharge: 05/02/2019  Attending Physician:  Garvin Fila, MD, Stroke MD Consultant(s):    Shara Blazing Matt Holmes, DO - TeleNeurology Patient's PCP:  Valera Castle, MD  DISCHARGE DIAGNOSIS:    Brainstem TIA treated with IV tPA.  Hyperlipidemia -> Lipitor  Diffuse T1 hypointense marrow signal within the partially imaged cervical spine on MRI Head. While this finding may be seen in the setting of a marrow infiltrative process, the most common causes are chronic anemia, smoking and obesity.   Ongoing tobacco use  Marijuana use   Past Medical History:  Diagnosis Date  . Prostate cancer [facility])    Past Surgical History:  Procedure Laterality Date  . HIP SURGERY Right 2015  . JOINT REPLACEMENT Right    hip  . PROSTATE BIOPSY    . RADIOACTIVE SEED IMPLANT N/A 08/20/2016   Procedure: RADIOACTIVE SEED IMPLANT/BRACHYTHERAPY IMPLANT;  Surgeon: Hollice Espy, MD;  Location: [facility] ORS;  Service: Urology;  Laterality: N/A;  . TONSILLECTOMY      Family History Family History  Problem Relation Age of Onset  . Prostate cancer Neg Hx   . Bladder Cancer Neg Hx   . Kidney cancer Neg Hx     Social History  reports that he has been smoking cigarettes. He has been smoking about 0.25 packs per day. He has never used smokeless tobacco. He reports current alcohol use. He reports current drug use. Drug: Marijuana.  Allergies as of 05/02/2019      Reactions   Shellfish Allergy Anaphylaxis   Hives & throat closes      Medication List    STOP taking these medications   sildenafil 20 MG tablet Commonly known as: REVATIO     TAKE these medications   aspirin 81 MG EC tablet Take 1 tablet (81 mg total) by mouth daily.   atorvastatin 80 MG tablet Commonly known as: LIPITOR Take 1 tablet (80 mg total) by mouth daily at 6 PM.   CLEAR EYES COMPLETE OP Place 1 drop into  both eyes 3 (three) times daily as needed (dry eyes).   clopidogrel 75 MG tablet Commonly known as: PLAVIX Take 1 tablet (75 mg total) by mouth daily for 21 days.   ibuprofen 200 MG tablet Commonly known as: ADVIL Take 400 mg by mouth 2 (two) times daily as needed for moderate pain.   tamsulosin 0.4 MG Caps capsule Commonly known as: FLOMAX TAKE 1 CAPSULE (0.4 MG TOTAL) BY MOUTH DAILY AFTER SUPPER.   trolamine salicylate 10 % cream Commonly known as: ASPERCREME Apply 1 application topically 2 (two) times daily as needed for muscle pain.       HOME MEDICATIONS PRIOR TO ADMISSION Medications Prior to Admission  Medication Sig Dispense Refill  . Hyprom-Naphaz-Polysorb-Zn Sulf (CLEAR EYES COMPLETE OP) Place 1 drop into both eyes 3 (three) times daily as needed (dry eyes).    Marland Kitchen ibuprofen (ADVIL,MOTRIN) 200 MG tablet Take 400 mg by mouth 2 (two) times daily as needed for moderate pain.    Marland Kitchen trolamine salicylate (ASPERCREME) 10 % cream Apply 1 application topically 2 (two) times daily as needed for muscle pain.    . sildenafil (REVATIO) 20 MG tablet May use 1-5 tabs daily 1/2 hr before intercourse (Patient not taking: Reported on 05/01/2019) 20 tablet 0  . tamsulosin (FLOMAX) 0.4 MG CAPS capsule TAKE 1  CAPSULE (0.4 MG TOTAL) BY MOUTH DAILY AFTER SUPPER. (Patient not taking: Reported on 05/01/2019) 30 capsule Lindsay .  stroke: mapping our early stages of recovery book   Does not apply Once  . aspirin EC  81 mg Oral Daily  . atorvastatin  80 mg Oral q1800  . clopidogrel  75 mg Oral Daily  . tamsulosin  0.4 mg Oral QPC supper    LABORATORY STUDIES CBC    Component Value Date/Time   WBC 5.3 05/01/2019 0941   RBC 4.70 05/01/2019 0941   HGB 14.7 05/01/2019 0941   HGB 11.6 (L) 04/02/2014 0534   HCT 44.0 05/01/2019 0941   HCT 40.7 03/24/2014 0904   PLT 208 05/01/2019 0941   PLT 185 04/02/2014 0534   MCV 93.6 05/01/2019 0941   MCV 91 03/24/2014 0904   MCH 31.3  05/01/2019 0941   MCHC 33.4 05/01/2019 0941   RDW 13.9 05/01/2019 0941   RDW 14.7 (H) 03/24/2014 0904   LYMPHSABS 1.2 05/01/2019 0941   MONOABS 0.5 05/01/2019 0941   EOSABS 0.0 05/01/2019 0941   BASOSABS 0.0 05/01/2019 0941   CMP    Component Value Date/Time   NA 138 05/01/2019 0941   NA 138 04/02/2014 0534   K 3.7 05/01/2019 0941   K 4.0 04/02/2014 0534   CL 106 05/01/2019 0941   CL 106 04/02/2014 0534   CO2 22 05/01/2019 0941   CO2 24 04/02/2014 0534   GLUCOSE 157 (H) 05/01/2019 0941   GLUCOSE 95 04/02/2014 0534   BUN 18 05/01/2019 0941   BUN 12 04/02/2014 0534   CREATININE 0.85 05/01/2019 0941   CREATININE 0.99 04/02/2014 0534   CALCIUM 8.7 (L) 05/01/2019 0941   CALCIUM 7.8 (L) 04/02/2014 0534   PROT 8.0 05/01/2019 0941   ALBUMIN 4.5 05/01/2019 0941   AST 32 05/01/2019 0941   ALT 31 05/01/2019 0941   ALKPHOS 84 05/01/2019 0941   BILITOT 0.8 05/01/2019 0941   GFRNONAA >60 05/01/2019 0941   GFRNONAA >60 04/02/2014 0534   GFRAA >60 05/01/2019 0941   GFRAA >60 04/02/2014 0534   COAGS Lab Results  Component Value Date   INR 0.9 05/01/2019   INR 0.9 03/24/2014   Lipid Panel    Component Value Date/Time   CHOL 200 05/02/2019 0615   TRIG 87 05/02/2019 0615   HDL 56 05/02/2019 0615   CHOLHDL 3.6 05/02/2019 0615   VLDL 17 05/02/2019 0615   LDLCALC 127 (H) 05/02/2019 0615   HgbA1C  Lab Results  Component Value Date   HGBA1C 5.7 (H) 05/02/2019   Urinalysis    Component Value Date/Time   COLORURINE YELLOW 05/01/2019 1212   APPEARANCEUR CLEAR 05/01/2019 1212   APPEARANCEUR Clear 01/11/2016 1331   LABSPEC >1.046 (H) 05/01/2019 1212   LABSPEC 1.012 03/24/2014 0904   PHURINE 5.0 05/01/2019 1212   GLUCOSEU NEGATIVE 05/01/2019 1212   GLUCOSEU Negative 03/24/2014 0904   HGBUR SMALL (A) 05/01/2019 1212   BILIRUBINUR NEGATIVE 05/01/2019 1212   BILIRUBINUR Negative 01/11/2016 1331   BILIRUBINUR Negative 03/24/2014 0904   KETONESUR NEGATIVE 05/01/2019 1212    PROTEINUR 30 (A) 05/01/2019 1212   NITRITE NEGATIVE 05/01/2019 1212   LEUKOCYTESUR NEGATIVE 05/01/2019 1212   LEUKOCYTESUR Negative 03/24/2014 0904   Urine Drug Screen     Component Value Date/Time   LABOPIA NONE DETECTED 05/01/2019 1213   COCAINSCRNUR NONE DETECTED 05/01/2019 1213   COCAINSCRNUR NONE DETECTED 08/20/2016 0558   LABBENZ NONE DETECTED  05/01/2019 1213   AMPHETMU NONE DETECTED 05/01/2019 1213   THCU POSITIVE (A) 05/01/2019 1213   LABBARB NONE DETECTED 05/01/2019 1213    Alcohol Level    Component Value Date/Time   ETH <10 05/01/2019 1017     SIGNIFICANT DIAGNOSTIC STUDIES  CT HEAD CODE STROKE WO CONTRAST 05/01/2019 IMPRESSION:  Negative CT of the brain.   CT Angio Head W or Wo Contrast CT Angio Neck W and/or Wo Contrast 05/01/2019   ADDENDUM: Fractures of the medial orbital wall bilaterally likely chronic. No prior studies for comparison.  IMPRESSION:  1. Suboptimal arterial enhancement due to patient size and scan timing.  2. Mild atherosclerotic disease of the carotid bifurcation bilaterally without stenosis. Both vertebral arteries widely patent  3. No acute intracranial large vessel occlusion.   MR BRAIN WO CONTRAST 05/01/2019 IMPRESSION:  No evidence of acute intracranial abnormality, including acute infarct. Mild chronic small vessel ischemic disease. Diffuse T1 hypointense marrow signal within the partially imaged cervical spine. While this finding may be seen in the setting of a marrow infiltrative process, the most common causes are chronic anemia, smoking and obesity.   CT CEREBRAL PERFUSION W CONTRAST 05/01/2019 IMPRESSION:  No core infarct is identified by the perfusion software. Additionally, the perfusion software detects no region of critically hypoperfused parenchyma utilizing a Tmax>6 seconds threshold.   CT HEAD WO CONTRAST 05/02/2019 IMPRESSION: 1. Normal CT appearance of the brain for age. 2. Minimal sinus disease. 3. Remote medial  blowout fractures of both orbits.   ECHOCARDIOGRAM COMPLETE 05/01/2019 IMPRESSIONS   1. Left ventricular ejection fraction, by visual estimation, is 60 to 65%. The left ventricle has normal function. There is no left ventricular hypertrophy. Normal diastolic function.   2. Global right ventricle has normal systolic function.The right ventricular size is normal.   3. Left atrial size was normal.   4. Right atrial size was normal.   5. The mitral valve is normal in structure. No evidence of mitral valve regurgitation.   6. The tricuspid valve is normal in structure.   7. The aortic valve is tricuspid. Aortic valve regurgitation is not visualized. No evidence of aortic valve sclerosis or stenosis.   8. The pulmonic valve was not well visualized. Pulmonic valve regurgitation is not visualized.   9. TR signal is inadequate for assessing pulmonary artery systolic pressure.  10. The inferior vena cava is normal in size with <50% respiratory variability, suggesting right atrial pressure of 8 mmHg.   ECG - SR rate 62 BPM. (See cardiology reading for complete details)    HISTORY OF PRESENT ILLNESS (From Dr Yvetta Coder H&P 05/01/2019) Casey Burkitt a 54 y.o.malewithaPMHx ofprostate cancer,presenting in transfer to [facility] from OSH for further stroke management. He presented initially to [facility] with dizziness, diaphoresis and recurrent vomiting. Exam by EDP there showed dysmetria on theleft. Code Stroke was initiated and Teleneurologist recommended IV tPA after CT head showed no hemorrhage or acute hypodensity. Per patient he woke up this morning at 0300 and was his usual self. He proceeded to drive himself to work. At about 0800 he was outside trying to smoke a cigarette and had a sudden onset of dizziness (room spinning sensation) and began dry heaving. He sat down and this feeling passed. He then went back to work and had a secondepisode ofsudden-onset dizziness, but this time he became  diaphoretic,withdry heaving that progressed to vomiting and he had diarrhea as well. He was then driven to the [facility] ED by his boss.  After  tPA was started at [facility], aCTA of head and neck was ordered, revealingno LVO. There was mild atherosclerotic disease of the carotid bifurcationsbilaterally without stenosis. Both vertebral arterieswerewidely patent.CT perfusion has also been obtained, with no core infarct identified by the perfusion software. Additionally, the perfusion software detects no region of critically hypoperfused parenchyma utilizing a Tmax>6 seconds threshold. He denies any CP, weakness, tingling or numbness. He does take Aleve daily for arthritis. Endorses smoking 1.5 pack of cigarettes per day, occasional marijuana usage, and he drinks 1 shot of liquor and 3 beers per day. No prior stroke history. Initial NIHSS:1 for decreased sensation [facility]; decreased sensation onleft side.  HOSPITAL COURSE Mr. VASU CURLES is a 54 y.o. male with history of prostate cancer and tobacco usepresenting in transfer to [facility] from OSH for further stroke management. He presented initially to [facility] with dizziness, diaphoresis and recurrent vomiting. Exam by EDP there showed dysmetria on theleft. Code Stroke was initiated and Teleneurologist recommended IV tPA - received Friday 05/01/19 at 10:30 AM.  Brainstem TIA treated with IV tPA   Resultant - deficits resololved  Code Stroke CT Head - negative  CT head - 05/02/2019 -  Normal CT appearance of the brain for age.  MRI head - No evidence of acute intracranial abnormality, including acute infarct. Mild chronic small vessel ischemic disease  MRA head - not ordered  CTA H&N - Suboptimal arterial enhancement due to patient size and scan timing. Mild atherosclerotic disease of the carotid bifurcation bilaterally without stenosis. Both vertebral arteries widely patent  No acute intracranial  large vessel occlusion.   CT Perfusion - No core infarct is identified by the perfusion software. Additionally, the perfusion software detects no region of critically hypoperfused parenchyma.  Carotid Doppler - CTA neck performed - carotid dopplers not indicated.  2D Echo - EF 60 - 65%. No cardiac source of emboli identified.   Sars Corona Virus 2 - negative  LDL - 157  HgbA1c - 5.7  UDS - THCU   VTE prophylaxis - SCDs  Diet - Heart healthy / carb modified with thin liquids.  No antithrombotic prior to admission, now on No antithrombotic S/P tPA f/u head CT pending  Patient will be counseled to be compliant with his antithrombotic medications  Ongoing aggressive stroke risk factor management  Therapy recommendations:  No F/U PT recommended  Disposition:  Discharge  Hypertension  Home BP meds: none   Current BP meds: prn cleviprex, Labetalol, and hydralazine  Stable  Permissive hypertension (OK if < 220/120) but gradually normalize in 5-7 days   Long-term BP goal normotensive  Hyperlipidemia  Home Lipid lowering medication: none   LDL 157, goal < 70  Current lipid lowering medication: add Lipitor 80 mg daily  Continue statin at discharge  Other Stroke Risk Factors  Cigarette smoker - advised to stop smoking  ETOH use, advised to drink no more than 1 alcoholic beverage per day.  Obesity, Body mass index is 39.43 kg/m., recommend weight loss, diet and exercise as appropriate   Substance Abuse - marijuana  Other Active Problems  Diffuse T1 hypointense marrow signal within the partially imaged cervical spine. May be seen in the setting of a marrow infiltrative process, the most common causes are chronic anemia, smoking and obesity. Primary care follow up.  Hyperglycemia - Primary care f/u   DISCHARGE EXAM Vitals:   05/02/19 0800 05/02/19 0900 05/02/19 1000 05/02/19 1100  BP: (!) 147/87 Marland Kitchen)  151/87 (!) 158/67 (!) 152/65  Pulse: 84 82 72 70   Resp: 19 17 12    Temp: 98.8 F (37.1 C)     TempSrc: Oral     SpO2: 99% 98% 98%   Weight:      Height:       Obese pleasant middle aged male not in distress, . Afebrile. Head is nontraumatic. Neck is supple without bruit.    Cardiac exam no murmur or gallop. Lungs are clear to auscultation. Distal pulses are well felt. Neurological Exam ;  Awake  Alert oriented x 3. Normal speech and language.eye movements full without nystagmus.fundi were not visualized. Vision acuity and fields appear normal. Hearing is normal. Palatal movements are normal. Face symmetric. Tongue midline. Normal strength, tone, reflexes and coordination. Normal sensation. Gait deferred.  Discharge Diet    Diet Order            Diet heart healthy/carb modified Room service appropriate? Yes; Fluid consistency: Thin  Diet effective now             liquids  DISCHARGE PLAN  Disposition:  Discharge to home  Aspirin 81 mg and Plavix 75 mg daily for 3 weeks - then stop Plavix but continue Aspirin 81 mg daily for secondary stroke prevention.  Ongoing risk factor control by Primary Care Physician at time of discharge  Patient counseled to quit marijuana  Follow-up Olmedo, Guy Begin, MD in 2 weeks. Instructed pt to discuss abnormal MRI neck with PCP.  Follow-up in [facility] in 4 weeks, office to schedule an appointment.   Recommend return to work in 2 weeks - sooner if feeling better.  40 minutes were spent preparing discharge.  Mikey Bussing PA-C [facility] Pager [phone] 05/02/2019, 11:55 AM I have personally obtained history,examined this patient, reviewed notes, independently viewed imaging studies, participated in medical decision making and plan of care.ROS completed by me personally and pertinent positives fully documented  I have made any additions or clarifications directly to the above note. Agree with note above.  Patient was counseled to quit  marijuana.  Recommend dual antiplatelet therapy aspirin Plavix for 3 weeks followed by aspirin alone.  He was advised to take statin for his elevated lipids and consider outpatient work-up for sleep apnea.  Antony Contras, MD Medical Director [facility] Pager: [phone] 05/02/2019 12:49 PM

## 2019-05-02 NOTE — Progress Notes (Signed)
OT Cancellation Note  Patient Details Name: TYLIEK SHOUSE MRN: [id number] DOB: March 08, 1966   Cancelled Treatment:    Reason Eval/Treat Not Completed: OT screened, no needs identified, will sign off. Spoke with PT and no OT needs where identified.  Tye Maryland , OTR/L Acute Rehab Services Pager [phone] Office [phone]      05/02/2019, 1:23 PM

## 2019-05-02 NOTE — Evaluation (Signed)
Physical Therapy Evaluation Patient Details Name: Bradley Shaw MRN: [id number] DOB: [birth year] Today's Date: 05/02/2019   History of Present Illness  54 year old male presenting initially to [facility] with dizziness, diaphoresis and recurrent vomiting. Exam by EDP there showed dysmetria on the left. Code Stroke was initiated and Teleneurologist recommended IV tPA after CT head showed no hemorrhage or acute hypodensity.    Clinical Impression  Pt is close to baseline functioning and should be safe at home with available assist. There are no further acute PT needs though pt is not quite at his unilateral balance baseline.  Will sign off at this time.     Follow Up Recommendations No PT follow up    Equipment Recommendations  None recommended by PT    Recommendations for Other Services       Precautions / Restrictions Precautions Precautions: (minimal fall risk)      Mobility  Bed Mobility Overal bed mobility: Independent                Transfers Overall transfer level: Independent                  Ambulation/Gait Ambulation/Gait assistance: Independent Gait Distance (Feet): 400 Feet Assistive device: None Gait Pattern/deviations: Step-through pattern   Gait velocity interpretation: >4.37 ft/sec, indicative of normal walking speed General Gait Details: steady overall with 1-2 episodes of mild deviation with maximal balance challenge.  Stairs Stairs: Yes   Stair Management: No rails;Alternating pattern;Forwards Number of Stairs: 12 General stair comments: safe  Wheelchair Mobility    Modified Rankin (Stroke Patients Only) Modified Rankin (Stroke Patients Only) Pre-Morbid Rankin Score: No symptoms Modified Rankin: No significant disability     Balance Overall balance assessment: Independent                               Standardized Balance Assessment Standardized Balance Assessment : Dynamic Gait Index   Dynamic Gait Index Level  Surface: Normal Change in Gait Speed: Normal Gait with Horizontal Head Turns: Normal Gait with Vertical Head Turns: Normal Gait and Pivot Turn: Mild Impairment Step Over Obstacle: Normal Step Around Obstacles: Normal Steps: Normal Total Score: 23       Pertinent Vitals/Pain Pain Assessment: No/denies pain    Home Living Family/patient expects to be discharged to:: Private residence Living Arrangements: Spouse/significant other Available Help at Discharge: Family;Available PRN/intermittently Type of Home: Apartment Home Access: Stairs to enter Entrance Stairs-Rails: Psychiatric nurse of Steps: flight Home Layout: One level Home Equipment: None      Prior Function Level of Independence: Independent         Comments: works, independent     Journalist, newspaper   Dominant Hand: Right    Extremity/Trunk Assessment   Upper Extremity Assessment Upper Extremity Assessment: Overall WFL for tasks assessed    Lower Extremity Assessment Lower Extremity Assessment: Overall WFL for tasks assessed    Cervical / Trunk Assessment Cervical / Trunk Assessment: Normal  Communication   Communication: No difficulties  Cognition Arousal/Alertness: Awake/alert Behavior During Therapy: WFL for tasks assessed/performed Overall Cognitive Status: Within Functional Limits for tasks assessed                                        General Comments General comments (skin integrity, edema, etc.): Discussed stroke risk factors and need to  find out his risks and try to make changes.  pt reports unilateral balance tasks not at baseline, but pt will be safe at home    Exercises     Assessment/Plan    PT Assessment Patent does not need any further PT services  PT Problem List         PT Treatment Interventions      PT Goals (Current goals can be found in the Care Plan section)  Acute Rehab PT Goals PT Goal Formulation: All assessment and education  complete, DC therapy    Frequency     Barriers to discharge        Co-evaluation               AM-PAC PT "6 Clicks" Mobility  Outcome Measure Help needed turning from your back to your side while in a flat bed without using bedrails?: None Help needed moving from lying on your back to sitting on the side of a flat bed without using bedrails?: None Help needed moving to and from a bed to a chair (including a wheelchair)?: None Help needed standing up from a chair using your arms (e.g., wheelchair or bedside chair)?: None Help needed to walk in hospital room?: None Help needed climbing 3-5 steps with a railing? : None 6 Click Score: 24    End of Session   Activity Tolerance: Patient tolerated treatment well Patient left: in bed;with call bell/phone within reach Nurse Communication: Mobility status PT Visit Diagnosis: Unsteadiness on feet (R26.81)    Time: [id number] PT Time Calculation (min) (ACUTE ONLY): 27 min   Charges:   PT Evaluation $PT Eval Moderate Complexity: 1 Mod PT Treatments $Gait Training: 8-22 mins        05/02/2019  Ginger Carne., PT Acute Rehabilitation Services [phone]  (pager) [phone]  (office)  Tessie Fass Khamari Sheehan 05/02/2019, 11:25 AM

## 2019-05-02 NOTE — Evaluation (Signed)
Speech Language Pathology Evaluation Patient Details Name: Bradley Shaw MRN: [id number] DOB: 1965/05/27 Today's Date: 05/02/2019 Time: [id number] SLP Time Calculation (min) (ACUTE ONLY): 17 min  Problem List:  Patient Active Problem List   Diagnosis Date Noted  . Stroke (cerebrum) ([place]) 05/01/2019  . Prostate Cancer  (Large Volume Moderate Risk) 05/01/2016  . Elevated PSA 04/23/2016  . Erectile dysfunction due to arterial insufficiency 04/23/2016   Past Medical History:  Past Medical History:  Diagnosis Date  . Prostate cancer [facility])    Past Surgical History:  Past Surgical History:  Procedure Laterality Date  . HIP SURGERY Right 2015  . JOINT REPLACEMENT Right    hip  . PROSTATE BIOPSY    . RADIOACTIVE SEED IMPLANT N/A 08/20/2016   Procedure: RADIOACTIVE SEED IMPLANT/BRACHYTHERAPY IMPLANT;  Surgeon: Hollice Espy, MD;  Location: [facility] ORS;  Service: Urology;  Laterality: N/A;  . TONSILLECTOMY     HPI:  54 year old male presenting initially to [facility] with dizziness, diaphoresis and recurrent vomiting. Exam by EDP there showed dysmetria on the left. Code Stroke was initiated and Teleneurologist recommended IV tPA after CT head showed no hemorrhage or acute hypodensity.    Assessment / Plan / Recommendation Clinical Impression  Cognitive-linguistic evaluation complete. Patient with mild difficulty with mathmatical reasoning skills which he reports is baseline, otherwise appearing [facility] for all areas addressed. No further SLP needs indicated at this time.     SLP Assessment  SLP Recommendation/Assessment: Patient does not need any further Speech Lanaguage Pathology Services    Follow Up Recommendations  None          SLP Evaluation Cognition  Overall Cognitive Status: Within Functional Limits for tasks assessed Orientation Level: Oriented X4       Comprehension  Auditory Comprehension Overall Auditory Comprehension: Appears within functional limits for tasks  assessed Visual Recognition/Discrimination Discrimination: Within Function Limits Reading Comprehension Reading Status: Not tested    Expression Expression Primary Mode of Expression: Verbal Verbal Expression Overall Verbal Expression: Appears within functional limits for tasks assessed   Oral / Motor  Oral Motor/Sensory Function Overall Oral Motor/Sensory Function: Within functional limits Motor Speech Overall Motor Speech: Appears within functional limits for tasks assessed   GO            Gabriel Rainwater MA, CCC-SLP          Bradley Shaw Meryl 05/02/2019, 10:22 AM

## 2019-05-02 NOTE — Discharge Instructions (Signed)
1. Take medications as prescribed. Aspirin 81 mg daily with Plavix 75 mg daily for 3 weeks then stop Plavix and continue Aspirin 81 mg daily.  2. Do not smoke. 3. 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 Please discuss this with you primary care MD to see if further evaluation is indicated.  4. Recommend return to work in 2 weeks or sooner if feeling better.  Dyslipidemia Dyslipidemia is an imbalance of waxy, fat-like substances (lipids) in the blood. The body needs lipids in small amounts. Dyslipidemia often involves a high level of cholesterol or triglycerides, which are types of lipids. Common forms of dyslipidemia include:  High levels of LDL cholesterol. LDL is the type of cholesterol that causes fatty deposits (plaques) to build up in the blood vessels that carry blood away from your heart (arteries).  Low levels of HDL cholesterol. HDL cholesterol is the type of cholesterol that protects against heart disease. High levels of HDL remove the LDL buildup from arteries.  High levels of triglycerides. Triglycerides are a fatty substance in the blood that is linked to a buildup of plaques in the arteries. What are the causes? Primary dyslipidemia is caused by changes (mutations) in genes that are passed down through families (inherited). These mutations cause several types of dyslipidemia. Secondary dyslipidemia is caused by lifestyle choices and diseases that lead to dyslipidemia, such as:  Eating a diet that is high in animal fat.  Not getting enough exercise.  Having diabetes, kidney disease, liver disease, or thyroid disease.  Drinking large amounts of alcohol.  Using certain medicines. What increases the risk? You are more likely to develop this condition if you are an older man or if you are a woman who has gone through menopause. Other risk factors include:  Having a family history of dyslipidemia.  Taking certain medicines, including birth control  pills, steroids, some diuretics, and beta-blockers.  Smoking cigarettes.  Eating a high-fat diet.  Having certain medical conditions such as diabetes, polycystic ovary syndrome (PCOS), kidney disease, liver disease, or hypothyroidism.  Not exercising regularly.  Being overweight or obese with too much belly fat. What are the signs or symptoms? In most cases, dyslipidemia does not usually cause any symptoms. In severe cases, very high lipid levels can cause:  Fatty bumps under the skin (xanthomas).  White or gray ring around the black center (pupil) of the eye. Very high triglyceride levels can cause inflammation of the pancreas (pancreatitis). How is this diagnosed? Your health care provider may diagnose dyslipidemia based on a routine blood test (fasting blood test). Because most people do not have symptoms of the condition, this blood testing (lipid profile) is done on adults age 61 and older and is repeated every 5 years. This test checks:  Total cholesterol. This measures the total amount of cholesterol in your blood, including LDL cholesterol, HDL cholesterol, and triglycerides. A healthy number is below 200.  LDL cholesterol. The target number for LDL cholesterol is different for each person, depending on individual risk factors. Ask your health care provider what your LDL cholesterol should be.  HDL cholesterol. An HDL level of 60 or higher is best because it helps to protect against heart disease. A number below 19 for men or below 44 for women increases the risk for heart disease.  Triglycerides. A healthy triglyceride number is below 150. If your lipid profile is abnormal, your health care provider may do other blood tests. How is this treated? Treatment depends  on the type of dyslipidemia that you have and your other risk factors for heart disease and stroke. Your health care provider will have a target range for your lipid levels based on this information. For many people,  this condition may be treated by lifestyle changes, such as diet and exercise. Your health care provider may recommend that you:  Get regular exercise.  Make changes to your diet.  Quit smoking if you smoke. If diet changes and exercise do not help you reach your goals, your health care provider may also prescribe medicine to lower lipids. The most commonly prescribed type of medicine lowers your LDL cholesterol (statin drug). If you have a high triglyceride level, your provider may prescribe another type of drug (fibrate) or an omega-3 fish oil supplement, or both. Follow these instructions at home:  Eating and drinking  Follow instructions from your health care provider or dietitian about eating or drinking restrictions.  Eat a healthy diet as told by your health care provider. This can help you reach and maintain a healthy weight, lower your LDL cholesterol, and raise your HDL cholesterol. This may include: ? Limiting your calories, if you are overweight. ? Eating more fruits, vegetables, whole grains, fish, and lean meats. ? Limiting saturated fat, trans fat, and cholesterol.  If you drink alcohol: ? Limit how much you use. ? Be aware of how much alcohol is in your drink. In the [place] one drink equals one 12 oz bottle of beer (355 mL), one 5 oz glass of wine (148 mL), or one 1 oz glass of hard liquor (44 mL).  Do not drink alcohol if: ? Your health care provider tells you not to drink. ? You are pregnant, may be pregnant, or are planning to become pregnant. Activity  Get regular exercise. Start an exercise and strength training program as told by your health care provider. Ask your health care provider what activities are safe for you. Your health care provider may recommend: ? 30 minutes of aerobic activity 4-6 days a week. Brisk walking is an example of aerobic activity. ? Strength training 2 days a week. General instructions  Do not use any products that contain nicotine or  tobacco, such as cigarettes, e-cigarettes, and chewing tobacco. If you need help quitting, ask your health care provider.  Take over-the-counter and prescription medicines only as told by your health care provider. This includes supplements.  Keep all follow-up visits as told by your health care provider. Contact a health care provider if:  You are: ? Having trouble sticking to your exercise or diet plan. ? Struggling to quit smoking or control your use of alcohol. Summary  Dyslipidemia often involves a high level of cholesterol or triglycerides, which are types of lipids.  Treatment depends on the type of dyslipidemia that you have and your other risk factors for heart disease and stroke.  For many people, treatment starts with lifestyle changes, such as diet and exercise.  Your health care provider may prescribe medicine to lower lipids. This information is not intended to replace advice given to you by your health care provider. Make sure you discuss any questions you have with your health care provider. Document Revised: 11/25/2017 Document Reviewed: 11/01/2017 Elsevier Patient Education  [place].   Preventing High Cholesterol Cholesterol is a white, waxy substance similar to fat that the human body needs to help build cells. The liver makes all the cholesterol that a person's body needs. Having high cholesterol (hypercholesterolemia) increases a  person's risk for heart disease and stroke. Extra (excess) cholesterol comes from the food the person eats. High cholesterol can often be prevented with diet and lifestyle changes. If you already have high cholesterol, you can control it with diet and lifestyle changes and with medicine. How can high cholesterol affect me? If you have high cholesterol, deposits (plaques) may build up on the walls of your arteries. The arteries are the blood vessels that carry blood away from your heart. Plaques make the arteries narrower and  stiffer. This can limit or block blood flow and cause blood clots to form. Blood clots:  Are tiny balls of cells that form in your blood.  Can move to the heart or brain, causing a heart attack or stroke. Plaques in arteries greatly increase your risk for heart attack and stroke.Making diet and lifestyle changes can reduce your risk for these conditions that may threaten your life. What can increase my risk? This condition is more likely to develop in people who:  Eat foods that are high in saturated fat or cholesterol. Saturated fat is mostly found in: ? Foods that contain animal fat, such as red meat and some dairy products. ? Certain fatty foods made from plants, such as tropical oils.  Are overweight.  Are not getting enough exercise.  Have a family history of high cholesterol. What actions can I take to prevent this? Nutrition   Eat less saturated fat.  Avoid trans fats (partially hydrogenated oils). These are often found in margarine and in some baked goods, fried foods, and snacks bought in packages.  Avoid precooked or cured meat, such as sausages or meat loaves.  Avoid foods and drinks that have added sugars.  Eat more fruits, vegetables, and whole grains.  Choose healthy sources of protein, such as fish, poultry, lean cuts of red meat, beans, peas, lentils, and nuts.  Choose healthy sources of fat, such as: ? Nuts. ? Vegetable oils, especially olive oil. ? Fish that have healthy fats (omega-3 fatty acids), such as mackerel or salmon. The items listed above may not be a complete list of recommended foods and beverages. Contact a dietitian for more information. Lifestyle  Lose weight if you are overweight. Losing 5-10 lb (2.3-4.5 kg) can help prevent or control high cholesterol. It can also lower your risk for diabetes and high blood pressure. Ask your health care provider to help you with a diet and exercise plan to lose weight safely.  Do not use any products that  contain nicotine or tobacco, such as cigarettes, e-cigarettes, and chewing tobacco. If you need help quitting, ask your health care provider.  Limit your alcohol intake. ? Do not drink alcohol if:  Your health care provider tells you not to drink.  You are pregnant, may be pregnant, or are planning to become pregnant. ? If you drink alcohol:  Limit how much you use to:  0-1 drink a day for women.  0-2 drinks a day for men.  Be aware of how much alcohol is in your drink. In the [place] one drink equals one 12 oz bottle of beer (355 mL), one 5 oz glass of wine (148 mL), or one 1 oz glass of hard liquor (44 mL). Activity   Get enough exercise. Each week, do at least 150 minutes of exercise that takes a medium level of effort (moderate-intensity exercise). ? This is exercise that:  Makes your heart beat faster and makes you breathe harder than usual.  Allows you to still  be able to talk. ? You could exercise in short sessions several times a day or longer sessions a few times a week. For example, on 5 days each week, you could walk fast or ride your bike 3 times a day for 10 minutes each time.  Do exercises as told by your health care provider. Medicines  In addition to diet and lifestyle changes, your health care provider may recommend medicines to help lower cholesterol. This may be a medicine to lower the amount of cholesterol your liver makes. You may need medicine if: ? Diet and lifestyle changes do not lower your cholesterol enough. ? You have high cholesterol and other risk factors for heart disease or stroke.  Take over-the-counter and prescription medicines only as told by your health care provider. General information  Manage your risk factors for high cholesterol. Talk with your health care provider about all your risk factors and how to lower your risk.  Manage other conditions that you have, such as diabetes or high blood pressure (hypertension).  Have blood tests to  check your cholesterol levels at regular points in time as told by your health care provider.  Keep all follow-up visits as told by your health care provider. This is important. Where to find more information  American Heart Association: www.heart.org  National Heart, Lung, and Blood Institute: https://wilson-eaton.com/ Summary  High cholesterol increases your risk for heart disease and stroke. By keeping your cholesterol level low, you can reduce your risk for these conditions.  High cholesterol can often be prevented with diet and lifestyle changes.  Work with your health care provider to manage your risk factors, and have your blood tested regularly. This information is not intended to replace advice given to you by your health care provider. Make sure you discuss any questions you have with your health care provider. Document Revised: 07/25/2018 Document Reviewed: 12/10/2015 Elsevier Patient Education  [place].   High Cholesterol  High cholesterol is a condition in which the blood has high levels of a white, waxy, fat-like substance (cholesterol). The human body needs small amounts of cholesterol. The liver makes all the cholesterol that the body needs. Extra (excess) cholesterol comes from the food that we eat. Cholesterol is carried from the liver by the blood through the blood vessels. If you have high cholesterol, deposits (plaques) may build up on the walls of your blood vessels (arteries). Plaques make the arteries narrower and stiffer. Cholesterol plaques increase your risk for heart attack and stroke. Work with your health care provider to keep your cholesterol levels in a healthy range. What increases the risk? This condition is more likely to develop in people who:  Eat foods that are high in animal fat (saturated fat) or cholesterol.  Are overweight.  Are not getting enough exercise.  Have a family history of high cholesterol. What are the signs or symptoms? There  are no symptoms of this condition. How is this diagnosed? This condition may be diagnosed from the results of a blood test.  If you are older than age 67, your health care provider may check your cholesterol every 4-6 years.  You may be checked more often if you already have high cholesterol or other risk factors for heart disease. The blood test for cholesterol measures:  "Bad" cholesterol (LDL cholesterol). This is the main type of cholesterol that causes heart disease. The desired level for LDL is less than 100.  "Good" cholesterol (HDL cholesterol). This type helps to protect against  heart disease by cleaning the arteries and carrying the LDL away. The desired level for HDL is 60 or higher.  Triglycerides. These are fats that the body can store or burn for energy. The desired number for triglycerides is lower than 150.  Total cholesterol. This is a measure of the total amount of cholesterol in your blood, including LDL cholesterol, HDL cholesterol, and triglycerides. A healthy number is less than 200. How is this treated? This condition is treated with diet changes, lifestyle changes, and medicines. Diet changes  This may include eating more whole grains, fruits, vegetables, nuts, and fish.  This may also include cutting back on red meat and foods that have a lot of added sugar. Lifestyle changes  Changes may include getting at least 40 minutes of aerobic exercise 3 times a week. Aerobic exercises include walking, biking, and swimming. Aerobic exercise along with a healthy diet can help you maintain a healthy weight.  Changes may also include quitting smoking. Medicines  Medicines are usually given if diet and lifestyle changes have failed to reduce your cholesterol to healthy levels.  Your health care provider may prescribe a statin medicine. Statin medicines have been shown to reduce cholesterol, which can reduce the risk of heart disease. Follow these instructions at  home: Eating and drinking If told by your health care provider:  Eat chicken (without skin), fish, veal, shellfish, ground [place] breast, and round or loin cuts of red meat.  Do not eat fried foods or fatty meats, such as hot dogs and salami.  Eat plenty of fruits, such as apples.  Eat plenty of vegetables, such as broccoli, potatoes, and carrots.  Eat beans, peas, and lentils.  Eat grains such as barley, rice, couscous, and bulgur wheat.  Eat pasta without cream sauces.  Use skim or nonfat milk, and eat low-fat or nonfat yogurt and cheeses.  Do not eat or drink whole milk, cream, ice cream, egg yolks, or hard cheeses.  Do not eat stick margarine or tub margarines that contain trans fats (also called partially hydrogenated oils).  Do not eat saturated tropical oils, such as coconut oil and palm oil.  Do not eat cakes, cookies, crackers, or other baked goods that contain trans fats.  General instructions  Exercise as directed by your health care provider. Increase your activity level with activities such as gardening, walking, and taking the stairs.  Take over-the-counter and prescription medicines only as told by your health care provider.  Do not use any products that contain nicotine or tobacco, such as cigarettes and e-cigarettes. If you need help quitting, ask your health care provider.  Keep all follow-up visits as told by your health care provider. This is important. Contact a health care provider if:  You are struggling to maintain a healthy diet or weight.  You need help to start on an exercise program.  You need help to stop smoking. Get help right away if:  You have chest pain.  You have trouble breathing. This information is not intended to replace advice given to you by your health care provider. Make sure you discuss any questions you have with your health care provider. Document Revised: 04/05/2017 Document Reviewed: 10/01/2015 Elsevier Patient  Education  [place].

## 2019-06-03 ENCOUNTER — Encounter: Payer: Self-pay | Admitting: Adult Health

## 2019-06-03 ENCOUNTER — Other Ambulatory Visit: Payer: Self-pay

## 2019-06-03 ENCOUNTER — Ambulatory Visit (INDEPENDENT_AMBULATORY_CARE_PROVIDER_SITE_OTHER): Payer: Managed Care, Other (non HMO) | Admitting: Adult Health

## 2019-06-03 VITALS — BP 145/76 | HR 75 | Temp 98.0°F | Ht 67.0 in | Wt 255.4 lb

## 2019-06-03 DIAGNOSIS — E785 Hyperlipidemia, unspecified: Secondary | ICD-10-CM

## 2019-06-03 DIAGNOSIS — I10 Essential (primary) hypertension: Secondary | ICD-10-CM | POA: Diagnosis not present

## 2019-06-03 DIAGNOSIS — G459 Transient cerebral ischemic attack, unspecified: Secondary | ICD-10-CM

## 2019-06-03 DIAGNOSIS — Z9189 Other specified personal risk factors, not elsewhere classified: Secondary | ICD-10-CM | POA: Diagnosis not present

## 2019-06-03 NOTE — Progress Notes (Signed)
[facility] [address] [place]. [place] [address] [phone]       HOSPITAL FOLLOW UP NOTE  [name] Date of Birth:  12-Jun-1965 Medical Record Number:  [id number]   Reason for Referral:  hospital stroke follow up    CHIEF COMPLAINT:  Chief Complaint  Patient presents with  . Hospitalization Follow-up    Alone. Rm 9. Patient mentioned that he has been having some redness to his right eye and would like to know could be that because of his blood pressure.    HPI: Bradley Shaw being seen today for in office hospital follow-up regarding TIA status post TPA on 05/01/2019.  History obtained from patient and chart review. Reviewed all radiology images and labs personally.  Bradley Shaw is a 54 year old male with history of prostate cancer and tobacco use who presented to [facility] on 05/01/2019 with acute onset of dizziness, diaphoresis and recurrent vomiting as well as evidence of dysmetria on the left.  Received TPA and transferred to [facility] for further management.  Evaluated by stroke team and Dr. Leonie Man with likely brainstem TIA s/p IV tPA with resolution of deficits.  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  All other imaging unremarkable.  COVID-19 negative.  UDS positive for THC.  HTN stable.  LDL 157 and initiated atorvastatin 80 mg daily.  No evidence or history of DM with A1c of 5.7.  Other stroke risk factors include tobacco use, EtOH use, obesity and THC use.  Recommended DAPT for 3 weeks then aspirin alone.  Discharged home in stable condition without therapy needs.  Bradley Shaw is a 54 year old male who is being seen today for hospital follow-up.  He has been stable since discharge without reoccurring or new stroke/TIA symptoms.  Continues on DAPT despite 3-week recommendation but denies bleeding or bruising.  Continues on atorvastatin 80 mg daily without myalgias.  Blood pressure today  145/76.  Continues to smoke approximately 2 to 3 cigarettes/day previously 1.5 packs/day with plan on continued tapering until complete cessation.  He does mention 34-month duration of right eye pain and redness with occasional blurriness.  He reports being seen by PCP and per patient he felt likely due to inflammation but no further evaluation completed.  He does report having concrete debris in his eye during working approximately 3 months ago and remembers rubbing right eye trying to remove debris.  He does report occasional crust forming in eye. Denies itching.  No further concerns at this time.      ROS:   14 system review of systems performed and negative with exception of eye redness, blurred vision, eye pain  PMH:  Past Medical History:  Diagnosis Date  . Prostate cancer ([place])     PSH:  Past Surgical History:  Procedure Laterality Date  . HIP SURGERY Right 2015  . JOINT REPLACEMENT Right    hip  . PROSTATE BIOPSY    . RADIOACTIVE SEED IMPLANT N/A 08/20/2016   Procedure: RADIOACTIVE SEED IMPLANT/BRACHYTHERAPY IMPLANT;  Surgeon: Hollice Espy, MD;  Location: [facility] ORS;  Service: Urology;  Laterality: N/A;  . TONSILLECTOMY      Social History:  Social History   Socioeconomic History  . Marital status: Married    Spouse name: Not on file  . Number of children: Not on file  . Years of education: Not on file  . Highest education level: Not on file  Occupational History  . Not on file  Tobacco Use  . Smoking status: Current Every Day Smoker    Packs/day: 0.25    Types: Cigarettes  . Smokeless tobacco: Never Used  Substance and Sexual Activity  . Alcohol use: Yes    Comment: beer occassional  . Drug use: Yes    Types: Marijuana  . Sexual activity: Yes  Other Topics Concern  . Not on file  Social History Narrative  . Not on file   Social Determinants of Health   Financial Resource Strain:   . Difficulty of Paying Living Expenses: Not on file  Food Insecurity:     . Worried About Charity fundraiser in the Last Year: Not on file  . Ran Out of Food in the Last Year: Not on file  Transportation Needs:   . Lack of Transportation (Medical): Not on file  . Lack of Transportation (Non-Medical): Not on file  Physical Activity:   . Days of Exercise per Week: Not on file  . Minutes of Exercise per Session: Not on file  Stress:   . Feeling of Stress : Not on file  Social Connections:   . Frequency of Communication with Friends and Family: Not on file  . Frequency of Social Gatherings with Friends and Family: Not on file  . Attends Religious Services: Not on file  . Active Member of Clubs or Organizations: Not on file  . Attends Archivist Meetings: Not on file  . Marital Status: Not on file  Intimate Partner Violence:   . Fear of Current or Ex-Partner: Not on file  . Emotionally Abused: Not on file  . Physically Abused: Not on file  . Sexually Abused: Not on file    Family History:  Family History  Problem Relation Age of Onset  . Prostate cancer Neg Hx   . Bladder Cancer Neg Hx   . Kidney cancer Neg Hx     Medications:   Current Outpatient Medications on File Prior to Visit  Medication Sig Dispense Refill  . acetaminophen (TYLENOL) 500 MG tablet Take 1,000 mg by mouth as needed.    Marland Kitchen aspirin EC 81 MG EC tablet Take 1 tablet (81 mg total) by mouth daily.    Marland Kitchen atorvastatin (LIPITOR) 80 MG tablet Take 1 tablet (80 mg total) by mouth daily at 6 PM. 30 tablet 3  . Hyprom-Naphaz-Polysorb-Zn Sulf (CLEAR EYES COMPLETE OP) Place 1 drop into both eyes 3 (three) times daily as needed (dry eyes).    . tamsulosin (FLOMAX) 0.4 MG CAPS capsule TAKE 1 CAPSULE (0.4 MG TOTAL) BY MOUTH DAILY AFTER SUPPER. 30 capsule 11   No current facility-administered medications on file prior to visit.    Allergies:   Allergies  Allergen Reactions  . Shellfish Allergy Anaphylaxis    Hives & throat closes     Physical Exam  Vitals:   06/03/19 0900   BP: (!) 145/76  Pulse: 75  Temp: 98 F (36.7 C)  TempSrc: Oral  Weight: 255 lb 6.4 oz (115.8 kg)  Height: 5\' 7"  (1.702 m)   Body mass index is 40 kg/m. No exam data present  Depression screen [facility] 2/9 06/03/2019  Decreased Interest 0  Down, Depressed, Hopeless 0  PHQ - 2 Score 0     General: well developed, well nourished,  pleasant middle-aged African-American male, seated, in no evident distress Head: head normocephalic and atraumatic.   Eyes: Red lower conjunctivae R>L; mild R purulent drainage in the corner Neck: supple with no carotid or  supraclavicular bruits Cardiovascular: regular rate and rhythm, no murmurs Musculoskeletal: no deformity Skin:  no rash/petichiae Vascular:  Normal pulses all extremities   Neurologic Exam Mental Status: Awake and fully alert.   Normal speech and language.  Oriented to place and time. Recent and remote memory intact. Attention span, concentration and fund of knowledge appropriate. Mood and affect appropriate.  Cranial Nerves: Fundoscopic exam reveals sharp disc margins. Pupils equal, briskly reactive to light. Extraocular movements full without nystagmus. Visual fields full to confrontation. Hearing intact. Facial sensation intact. Face, tongue, palate moves normally and symmetrically.  Motor: Normal bulk and tone. Normal strength in all tested extremity muscles. Sensory.: intact to touch , pinprick , position and vibratory sensation.  Coordination: Rapid alternating movements normal in all extremities. Finger-to-nose and heel-to-shin performed accurately bilaterally. Gait and Station: Arises from chair without difficulty. Stance is normal. Gait demonstrates normal stride length and balance Reflexes: 1+ and symmetric. Toes downgoing.     NIHSS  0 Modified Rankin  0    Diagnostic Data (Labs, Imaging, Testing)  CT HEAD CODE STROKE WO CONTRAST 05/01/2019 IMPRESSION:  Negative CT of the brain.   CT Angio Head W or Wo Contrast CT  Angio Neck W and/or Wo Contrast 05/01/2019  ADDENDUM: Fractures of the medial orbital wall bilaterally likely chronic. No prior studies for comparison. IMPRESSION:  1. Suboptimal arterial enhancement due to patient size and scan timing.  2. Mild atherosclerotic disease of the carotid bifurcation bilaterally without stenosis. Both vertebral arteries widely patent  3. No acute intracranial large vessel occlusion.   MR BRAIN WO CONTRAST 05/01/2019 IMPRESSION:  No evidence of acute intracranial abnormality, including acute infarct. Mild chronic small vessel ischemic disease. Diffuse T1 hypointense marrow signal within the partially imaged cervical spine. While this finding may be seen in the setting of a marrow infiltrative process, the most common causes are chronic anemia, smoking and obesity.   CT CEREBRAL PERFUSION W CONTRAST 05/01/2019 IMPRESSION:  No core infarct is identified by the perfusion software. Additionally, the perfusion software detects no region of critically hypoperfused parenchyma utilizing a Tmax>6 seconds threshold.   CT HEAD WO CONTRAST 05/02/2019 IMPRESSION: 1. Normal CT appearance of the brain for age. 2. Minimal sinus disease. 3. Remote medial blowout fractures of both orbits.   ECHOCARDIOGRAM COMPLETE 05/01/2019 IMPRESSIONS  1. Left ventricular ejection fraction, by visual estimation, is60 to 65%.The left ventricle has normal function. There is no left ventricular hypertrophy. Normal diastolic function.  2. Global right ventricle has normal systolic function.The right ventricular size is normal.  3. Left atrial size was normal.  4. Right atrial size was normal.  5. The mitral valve is normal in structure. No evidence of mitral valve regurgitation.  6. The tricuspid valve is normal in structure.  7. The aortic valve is tricuspid. Aortic valve regurgitation is not visualized. No evidence of aortic valve sclerosis or stenosis.  8. The pulmonic valve  was not well visualized. Pulmonic valve regurgitation is not visualized.  9. TR signal is inadequate for assessing pulmonary artery systolic pressure.  10. The inferior vena cava is normal in size with <50% respiratory variability, suggesting right atrial pressure of 8 mmHg.  ECG - SR rate62BPM. (See cardiology reading for complete details)    ASSESSMENT: Bradley Shaw is a 54 y.o. year old male presented with dizziness, diaphoresis and recurrent vomiting on 05/01/2019 with TIA s/p tPA secondary to likely small vessel disease. Vascular risk factors include HTN, HLD, mild arthrosclerosis tobacco use, and THC  use.  Recovered well from stroke standpoint without residual deficits.  Patient reports 51-month history of right eye irritation, pain, redness and occasional blurred vision with report of concrete debris accidentally in eye during work around the time symptoms started.    PLAN:  1. TIA : Continue aspirin 81 mg daily  and atorvastatin 80mg  daily  for secondary stroke prevention.  Advised to discontinue Plavix and remain on aspirin alone as prolonged DAPT not indicated.  Maintain strict control of hypertension with blood pressure goal below 130/90, diabetes with hemoglobin A1c goal below 6.5% and cholesterol with LDL cholesterol (bad cholesterol) goal below 70 mg/dL.  I also advised the patient to eat a healthy diet with plenty of whole grains, cereals, fruits and vegetables, exercise regularly with at least 30 minutes of continuous activity daily and maintain ideal body weight. 2. HTN: Advised to continue current treatment regimen.  Today's BP stable.  Advised to continue to monitor at home along with continued follow-up with PCP for management 3. HLD: Advised to continue current treatment regimen along with continued follow-up with PCP for future prescribing and monitoring of lipid panel 4. Tobacco use: Highly encouraged complete cessation and encouraged decreasing amount until complete  cessation 5. Eye redness: Differential diagnosis dry eye vs corneal abrasion vs conjunctivitis.  Referral placed to ophthalmology for further evaluation and to ensure no foreign object remains in eye.     Follow up in 4 months or call earlier if needed   Greater than 50% of time during this 45 minute visit was spent on counseling, explanation of diagnosis of TIA, reviewing risk factor management of HTN, HLD, tobacco use, THC use, discussion regarding high concerns and possible etiologies, planning of further management along with potential future management, and discussion with patient answering all questions to [place], AGNP-BC  [facility] [address] [place],  [address]  Phone [phone] Fax [phone] Note: This document was prepared with digital dictation and possible smart phrase technology. Any transcriptional errors that result from this process are unintentional.

## 2019-06-03 NOTE — Patient Instructions (Addendum)
Continue aspirin 81 mg daily  and atorvastatin for secondary stroke prevention  Discontinue Plavix at this time  Continue to follow up with PCP regarding cholesterol and blood pressure management   Continue to monitor blood pressure at home  Referral sent to eye doctor due to concern for corneal abrasion   Maintain strict control of hypertension with blood pressure goal below 130/90, diabetes with hemoglobin A1c goal below 6.5% and cholesterol with LDL cholesterol (bad cholesterol) goal below 70 mg/dL. I also advised the patient to eat a healthy diet with plenty of whole grains, cereals, fruits and vegetables, exercise regularly and maintain ideal body weight.  Followup in the future with me in 4 months or call earlier if needed       Thank you for coming to see [place] at [facility]. I hope we have been able to provide you high quality care today.  You may receive a patient satisfaction survey over the next few weeks. We would appreciate your feedback and comments so that we may continue to improve ourselves and the health of our patients.       Corneal Abrasion  A corneal abrasion is a scratch or injury to the clear covering over the front of your eye (cornea). This can be painful. It is important to get treatment for a corneal abrasion. If this problem is not treated, it can affect your eyesight (vision). What are the causes?  A poke in the eye.  An object in the eye.  Too much eye rubbing.  Very dry eyes.  Certain eye infections.  Contact lenses that do not fit right or are worn for too long. You can also injure your cornea when putting contact lenses in your eye or taking them out.  Eye surgery.  Certain cornea problems may increase the chance of a corneal abrasion. Sometimes, the cause is not known. What are the signs or symptoms?  Eye pain. The pain may get worse when you open and close your eye or when you move your eye.  A feeling of  something stuck in your eye.  Tearing, redness, and sensitivity to light.  Having trouble keeping your eye open, or not being able to keep it open.  Blurred vision.  Headache. How is this diagnosed? You may work with a health care provider who specializes in conditions of the eye (ophthalmologist). This condition may be diagnosed based on your medical history, symptoms, and an eye exam. How is this treated?  Washing out your eye.  Removing anything that is stuck in your eye.  Using antibiotic drops or ointment to treat or prevent an infection.  Using a dilating drop to decrease irritation, swelling, and pain.  Using steroid drops or ointment to treat redness, irritation, or swelling.  Applying a cold, wet cloth (cold compress) or ice pack to ease the pain  Taking pain medicine by mouth. In some cases, an eye patch or bandage soft contact lens might also be used. An eye patch should not be used if the corneal abrasion was related to contact lens wear as it can increase the chance of infection in these eyes. Follow these instructions at home: Medicines  Use eye drops or ointments as told by your doctor.  If you were prescribed antibiotic drops or ointment, use them as told by your doctor. Do not stop using the antibiotic even if you start to feel better.  Take over-the-counter and prescription medicines only as told by your doctor.  Ask your  doctor if the medicine prescribed to you: ? Requires you to avoid driving or using heavy machinery. ? Can cause trouble pooping (constipation). You may need to take these actions to prevent or treat trouble pooping:  Drink enough fluid to keep your pee (urine) pale yellow.  Take over-the-counter or prescription medicines.  Eat foods that are high in fiber. These include beans, whole grains, and fresh fruits and vegetables.  Limit foods that are high in fat and processed sugars. These include fried or sweet foods. Using an eye  patch If you have an eye patch, wear it as told by your doctor.  Do not drive or use machinery while wearing an eye patch.  Follow instructions from your doctor about when to take off the patch. General instructions  Ask your doctor if you can use a cold, wet cloth on your eye to help with pain.  Do not rub or touch your eye. Do not wash out your eye.  Do not wear contact lenses until your doctor says that this is okay.  Avoid bright light.  Avoid straining your eyes.  Keep all follow-up visits as told by your doctor. Doing this can help to prevent infection and loss of eyesight. Contact a doctor if:  You keep having eye pain and other symptoms for more than 2 days.  You get new symptoms, such as more redness, watery eyes, or discharge.  You have discharge that makes your eyelids stick together in the morning.  Your eye patch becomes so loose that you can blink your eye.  Symptoms come back after your eye heals. Get help right away if:  You have very bad eye pain that does not get better with medicine.  You lose eyesight. Summary  A corneal abrasion is a scratch or injury to the clear covering over the front of the eye (cornea).  It is important to get treatment for a corneal abrasion. If this problem is not treated, it can affect your eyesight (vision).  Use eye drops or ointments as told by your doctor.  If you have an eye patch, do not drive or use machinery while wearing it.  Let your doctor know if your symptoms last for more than 2 days. This information is not intended to replace advice given to you by your health care provider. Make sure you discuss any questions you have with your health care provider. Document Revised: 08/08/2018 Document Reviewed: 08/08/2018 Elsevier Patient Education  [employer].

## 2019-06-04 NOTE — Progress Notes (Signed)
I agree with the above plan 

## 2019-08-21 ENCOUNTER — Other Ambulatory Visit: Payer: Self-pay

## 2019-08-21 ENCOUNTER — Inpatient Hospital Stay: Payer: Managed Care, Other (non HMO) | Attending: Radiation Oncology

## 2019-08-21 DIAGNOSIS — Z923 Personal history of irradiation: Secondary | ICD-10-CM | POA: Diagnosis not present

## 2019-08-21 DIAGNOSIS — C61 Malignant neoplasm of prostate: Secondary | ICD-10-CM | POA: Insufficient documentation

## 2019-08-21 LAB — PSA: Prostatic Specific Antigen: 0.15 ng/mL (ref 0.00–4.00)

## 2019-08-27 ENCOUNTER — Other Ambulatory Visit: Payer: Self-pay

## 2019-08-27 ENCOUNTER — Encounter: Payer: Self-pay | Admitting: Radiation Oncology

## 2019-08-27 ENCOUNTER — Other Ambulatory Visit: Payer: Self-pay | Admitting: *Deleted

## 2019-08-27 ENCOUNTER — Ambulatory Visit
Admission: RE | Admit: 2019-08-27 | Discharge: 2019-08-27 | Disposition: A | Discharge status: Home / Self Care | Payer: Managed Care, Other (non HMO) | Source: Ambulatory Visit | Attending: Radiation Oncology | Admitting: Radiation Oncology

## 2019-08-27 ENCOUNTER — Encounter: Payer: Self-pay | Admitting: *Deleted

## 2019-08-27 VITALS — BP 147/90 | HR 84 | Temp 96.7°F | Resp 20 | Wt 253.6 lb

## 2019-08-27 DIAGNOSIS — E669 Obesity, unspecified: Secondary | ICD-10-CM | POA: Insufficient documentation

## 2019-08-27 DIAGNOSIS — Z720 Tobacco use: Secondary | ICD-10-CM | POA: Insufficient documentation

## 2019-08-27 DIAGNOSIS — Z923 Personal history of irradiation: Secondary | ICD-10-CM | POA: Diagnosis not present

## 2019-08-27 DIAGNOSIS — Z8673 Personal history of transient ischemic attack (TIA), and cerebral infarction without residual deficits: Secondary | ICD-10-CM | POA: Diagnosis not present

## 2019-08-27 DIAGNOSIS — C61 Malignant neoplasm of prostate: Secondary | ICD-10-CM | POA: Insufficient documentation

## 2019-08-27 MED ORDER — SILDENAFIL CITRATE 25 MG PO TABS: 25.0000 mg | ORAL_TABLET | Freq: Every day | ORAL | 4 refills | Status: DC | PRN

## 2019-08-27 NOTE — Progress Notes (Signed)
Radiation Oncology Follow up Note  Name: Bradley Shaw   Date:   08/27/2019 MRN:  [id number] DOB: [birth year]    This 54 y.o. male presents to the clinic today for 3-year follow-up status post external beam radiation therapy as well as an I-125 interstitial implant and androgen deprivation therapy for a Gleason 7 (3+4) presenting with a PSA of 13.6.  REFERRING PROVIDER: [place], *  HPI: Patient is a 54 year old male now out 3 years having completed both external beam radiation therapy to his prostate and pelvic nodes as well as an I-125 interstitial implant for boost for Gleason 7 (3+4) adenocarcinoma prostate presenting with a PSA of 13.6.  He is seen today in routine follow-up and is doing well.  Specifically denies any increased lower urinary tract symptoms diarrhea or fatigue.  His most recent PSA is 0.15.Marland Kitchen  Unfortunately patient had a mild CVA earlier this year although has no significant residual neurologic sequela.  Patient is on multiple imaging studies including CT scan of the head and MRI of the brain perfusion studies which I have reviewed showing no evidence to suggest metastatic disease.  COMPLICATIONS OF TREATMENT: none  FOLLOW UP COMPLIANCE: keeps appointments   PHYSICAL EXAM:  BP (!) 147/90 (BP Location: Left Arm, Patient Position: Sitting, Cuff Size: Large)   Pulse 84   Temp (!) 96.7 F (35.9 C) (Tympanic)   Resp 20   Wt 253 lb 9.6 oz (115 kg)   BMI 39.72 kg/m  Well-developed well-nourished patient in NAD. HEENT reveals PERLA, EOMI, discs not visualized.  Oral cavity is clear. No oral mucosal lesions are identified. Neck is clear without evidence of cervical or supraclavicular adenopathy. Lungs are clear to A&P. Cardiac examination is essentially unremarkable with regular rate and rhythm without murmur rub or thrill. Abdomen is benign with no organomegaly or masses noted. Motor sensory and DTR levels are equal and symmetric in the upper and lower extremities.  Cranial nerves II through XII are grossly intact. Proprioception is intact. No peripheral adenopathy or edema is identified. No motor or sensory levels are noted. Crude visual fields are within normal range.  RADIOLOGY RESULTS: CT scans of the head MRI of brain improved cerebral perfusion studies all reviewed  PLAN: At this time patient is doing extremely well under excellent biochemical control of his prostate cancer.  I am pleased with his overall progress.  I have asked to see the patient back in 1 year with a PSA at that time.  Patient knows to call with any concerns at any time.  I would like to take this opportunity to thank you for allowing me to participate in the care of your patient.Noreene Filbert, MD

## 2019-08-28 ENCOUNTER — Ambulatory Visit: Payer: Managed Care, Other (non HMO) | Admitting: Radiation Oncology

## 2019-10-07 ENCOUNTER — Ambulatory Visit: Payer: Managed Care, Other (non HMO) | Admitting: Adult Health

## 2019-10-07 NOTE — Progress Notes (Deleted)
[facility] [address] [place]. [place] [address] [phone]       STROKE FOLLOW UP NOTE  [name] Date of Birth:  [birth year] Medical Record Number:  [id number]   Reason for Referral: stroke follow up    CHIEF COMPLAINT:  No chief complaint on file.   HPI:  Today, 10/07/2019, Bradley Shaw returns for follow-up regarding TIA in 04/2019.  He has been stable since prior visit without new or reoccurring stroke/TIA symptoms.  Continues on aspirin 81 mg daily and atorvastatin 80 mg daily for secondary stroke prevention.  Blood pressure today ***.  Tobacco use ***.    History provided for reference purposes only Initial visit 06/03/2019 JM: Bradley Shaw is a 54 year old male who is being seen today for hospital follow-up.  He has been stable since discharge without reoccurring or new stroke/TIA symptoms.  Continues on DAPT despite 3-week recommendation but denies bleeding or bruising.  Continues on atorvastatin 80 mg daily without myalgias.  Blood pressure today 145/76.  Continues to smoke approximately 2 to 3 cigarettes/day previously 1.5 packs/day with plan on continued tapering until complete cessation. No further concerns at this time.   Hospital admission 05/01/2019: Bradley Shaw is a 54 year old male with history of prostate cancer and tobacco use who presented to [facility] on 05/01/2019 with acute onset of dizziness, diaphoresis and recurrent vomiting as well as evidence of dysmetria on the left.  Received TPA and transferred to [facility] for further management.  Evaluated by stroke team and Dr. Leonie Man with likely brainstem TIA s/p IV tPA with resolution of deficits.  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  All other imaging unremarkable.  COVID-19 negative.  UDS positive for THC.  HTN stable.  LDL 157 and initiated atorvastatin 80 mg daily.  No evidence or history of DM with A1c of 5.7.  Other stroke risk  factors include tobacco use, EtOH use, obesity and THC use.  Recommended DAPT for 3 weeks then aspirin alone.  Discharged home in stable condition without therapy needs.      ROS:   14 system review of systems performed and negative with exception of eye redness, blurred vision, eye pain  PMH:  Past Medical History:  Diagnosis Date  . Prostate cancer ([place])     PSH:  Past Surgical History:  Procedure Laterality Date  . HIP SURGERY Right 2015  . JOINT REPLACEMENT Right    hip  . PROSTATE BIOPSY    . RADIOACTIVE SEED IMPLANT N/A 08/20/2016   Procedure: RADIOACTIVE SEED IMPLANT/BRACHYTHERAPY IMPLANT;  Surgeon: Hollice Espy, MD;  Location: [facility] ORS;  Service: Urology;  Laterality: N/A;  . TONSILLECTOMY      Social History:  Social History   Socioeconomic History  . Marital status: Married    Spouse name: Not on file  . Number of children: Not on file  . Years of education: Not on file  . Highest education level: Not on file  Occupational History  . Not on file  Tobacco Use  . Smoking status: Current Every Day Smoker    Packs/day: 0.25    Types: Cigarettes  . Smokeless tobacco: Never Used  Substance and Sexual Activity  . Alcohol use: Yes    Comment: beer occassional  . Drug use: Yes    Types: Marijuana  . Sexual activity: Yes  Other Topics Concern  . Not on file  Social History Narrative  . Not on file   Social Determinants  of Health   Financial Resource Strain:   . Difficulty of Paying Living Expenses:   Food Insecurity:   . Worried About Charity fundraiser in the Last Year:   . Arboriculturist in the Last Year:   Transportation Needs:   . Film/video editor (Medical):   Marland Kitchen Lack of Transportation (Non-Medical):   Physical Activity:   . Days of Exercise per Week:   . Minutes of Exercise per Session:   Stress:   . Feeling of Stress :   Social Connections:   . Frequency of Communication with Friends and Family:   . Frequency of Social Gatherings  with Friends and Family:   . Attends Religious Services:   . Active Member of Clubs or Organizations:   . Attends Archivist Meetings:   Marland Kitchen Marital Status:   Intimate Partner Violence:   . Fear of Current or Ex-Partner:   . Emotionally Abused:   Marland Kitchen Physically Abused:   . Sexually Abused:     Family History:  Family History  Problem Relation Age of Onset  . Prostate cancer Neg Hx   . Bladder Cancer Neg Hx   . Kidney cancer Neg Hx     Medications:   Current Outpatient Medications on File Prior to Visit  Medication Sig Dispense Refill  . acetaminophen (TYLENOL) 500 MG tablet Take 1,000 mg by mouth as needed.    Marland Kitchen aspirin EC 81 MG EC tablet Take 1 tablet (81 mg total) by mouth daily.    Marland Kitchen atorvastatin (LIPITOR) 80 MG tablet Take 1 tablet (80 mg total) by mouth daily at 6 PM. 30 tablet 3  . clopidogrel (PLAVIX) 75 MG tablet Take 75 mg by mouth daily.    . Hyprom-Naphaz-Polysorb-Zn Sulf (CLEAR EYES COMPLETE OP) Place 1 drop into both eyes 3 (three) times daily as needed (dry eyes).    . sildenafil (VIAGRA) 25 MG tablet Take 1 tablet (25 mg total) by mouth daily as needed for erectile dysfunction. 15 tablet 4  . tamsulosin (FLOMAX) 0.4 MG CAPS capsule TAKE 1 CAPSULE (0.4 MG TOTAL) BY MOUTH DAILY AFTER SUPPER. 30 capsule 11   No current facility-administered medications on file prior to visit.    Allergies:   Allergies  Allergen Reactions  . Shellfish Allergy Anaphylaxis    Hives & throat closes     Physical Exam  There were no vitals filed for this visit. There is no height or weight on file to calculate BMI. No exam data present   General: well developed, well nourished,  pleasant middle-aged African-American male, seated, in no evident distress Head: head normocephalic and atraumatic.   Eyes: Red lower conjunctivae R>L; mild R purulent drainage in the corner Neck: supple with no carotid or supraclavicular bruits Cardiovascular: regular rate and rhythm, no  murmurs Musculoskeletal: no deformity Skin:  no rash/petichiae Vascular:  Normal pulses all extremities   Neurologic Exam Mental Status: Awake and fully alert.   Normal speech and language.  Oriented to place and time. Recent and remote memory intact. Attention span, concentration and fund of knowledge appropriate. Mood and affect appropriate.  Cranial Nerves: Pupils equal, briskly reactive to light. Extraocular movements full without nystagmus. Visual fields full to confrontation. Hearing intact. Facial sensation intact. Face, tongue, palate moves normally and symmetrically.  Motor: Normal bulk and tone. Normal strength in all tested extremity muscles. Sensory.: intact to touch , pinprick , position and vibratory sensation.  Coordination: Rapid alternating movements normal in all  extremities. Finger-to-nose and heel-to-shin performed accurately bilaterally. Gait and Station: Arises from chair without difficulty. Stance is normal. Gait demonstrates normal stride length and balance Reflexes: 1+ and symmetric. Toes downgoing.        ASSESSMENT: Bradley Shaw is a 54 y.o. year old male presented with dizziness, diaphoresis and recurrent vomiting on 05/01/2019 with TIA s/p tPA secondary to likely small vessel disease. Vascular risk factors include HTN, HLD, mild arthrosclerosis tobacco use, and THC use.      PLAN:  1. TIA :  -Continue aspirin 81 mg daily  and atorvastatin 80mg  daily  for secondary stroke prevention.   - blood pressure goal below 130/90 and cholesterol with LDL cholesterol (bad cholesterol) goal below 70 mg/dL.  2. HTN:  -Stable -Continue to follow with PCP for monitoring and management 3. HLD:  -Continue atorvastatin 80 mg daily -Prescribing, monitoring and management deferred to PCP 4. Tobacco use: Highly encouraged complete cessation and encouraged decreasing amount until complete cessation   Follow up in 4 months or call earlier if needed   I spent *** minutes  of face-to-face and non-face-to-face time with patient.  This included previsit chart review, lab review, study review, order entry, electronic health record documentation, patient education regarding history of TIA, importance of managing stroke risk factors, tobacco cessation and answered all questions to patient satisfaction   Frann Rider, [facility]  [facility] [address] [place], [place] [address]  Phone [phone] Fax [phone] Note: This document was prepared with digital dictation and possible smart phrase technology. Any transcriptional errors that result from this process are unintentional.

## 2019-10-08 ENCOUNTER — Ambulatory Visit: Payer: Managed Care, Other (non HMO) | Admitting: Adult Health

## 2020-08-19 ENCOUNTER — Inpatient Hospital Stay: Payer: Managed Care, Other (non HMO) | Attending: Radiation Oncology

## 2020-08-19 DIAGNOSIS — Z923 Personal history of irradiation: Secondary | ICD-10-CM | POA: Diagnosis not present

## 2020-08-19 DIAGNOSIS — C61 Malignant neoplasm of prostate: Secondary | ICD-10-CM | POA: Insufficient documentation

## 2020-08-19 LAB — PSA: Prostatic Specific Antigen: 0.02 ng/mL (ref 0.00–4.00)

## 2020-08-26 ENCOUNTER — Encounter: Payer: Self-pay | Admitting: Radiation Oncology

## 2020-08-26 ENCOUNTER — Ambulatory Visit
Admission: RE | Admit: 2020-08-26 | Discharge: 2020-08-26 | Disposition: A | Discharge status: Home / Self Care | Payer: Managed Care, Other (non HMO) | Source: Ambulatory Visit | Attending: Radiation Oncology | Admitting: Radiation Oncology

## 2020-08-26 VITALS — BP 149/95 | HR 84 | Temp 97.7°F | Resp 20 | Wt 254.4 lb

## 2020-08-26 DIAGNOSIS — C61 Malignant neoplasm of prostate: Secondary | ICD-10-CM | POA: Diagnosis not present

## 2020-08-26 DIAGNOSIS — Z923 Personal history of irradiation: Secondary | ICD-10-CM | POA: Insufficient documentation

## 2020-08-26 NOTE — Progress Notes (Signed)
Radiation Oncology Follow up Note  Name: Bradley Shaw   Date:   08/26/2020 MRN:  [id number] DOB: April 18, 1965    This 55 y.o. male presents to the clinic today for 4-year follow-up status post ADT therapy plus external beam radiation therapy to prostate and pelvic nodes plus I-125 interstitial implant for Gleason 7 (3+4) presenting with a PSA of 13.6.  REFERRING PROVIDER: Valera Castle, *  HPI: Patient is a 55 year old male now seen out for years having completed both external beam radiation therapy to prostate and pelvic nodes as well as I-125 interstitial implant for boost.  He is seen today in routine follow-up is doing well.  He specifically denies any increased lower urinary tract symptoms diarrhea or fatigue his PSA continues showing excellent biochemical response at 0.02.Marland Kitchen  COMPLICATIONS OF TREATMENT: none  FOLLOW UP COMPLIANCE: keeps appointments   PHYSICAL EXAM:  BP (!) 149/95   Pulse 84   Temp 97.7 F (36.5 C) (Tympanic)   Resp 20   Wt 254 lb 6.4 oz (115.4 kg)   BMI 39.84 kg/m  Well-developed well-nourished patient in NAD. HEENT reveals PERLA, EOMI, discs not visualized.  Oral cavity is clear. No oral mucosal lesions are identified. Neck is clear without evidence of cervical or supraclavicular adenopathy. Lungs are clear to A&P. Cardiac examination is essentially unremarkable with regular rate and rhythm without murmur rub or thrill. Abdomen is benign with no organomegaly or masses noted. Motor sensory and DTR levels are equal and symmetric in the upper and lower extremities. Cranial nerves II through XII are grossly intact. Proprioception is intact. No peripheral adenopathy or edema is identified. No motor or sensory levels are noted. Crude visual fields are within normal range.  RADIOLOGY RESULTS: No current films for review  PLAN: Present time patient remains out for years under excellent biochemical control of his prostate cancer and pleased with his overall  progress.  I will see him back in 1 year with a repeat PSA.  Patient knows to call with any concerns.  I would like to take this opportunity to thank you for allowing me to participate in the care of your patient.Noreene Filbert, MD

## 2021-05-10 IMAGING — CT CT HEAD CODE STROKE
4 series · 16 of 47 positions shown, 18 images · non-contrast
Comparison: None.

CLINICAL DATA: Code stroke. Nausea and vomiting for 2 hours with
vertigo

EXAM:
CT HEAD WITHOUT CONTRAST
TECHNIQUE: Contiguous axial images were obtained from the base of the skull
through the vertex without intravenous contrast.

[Series 2: head wo · axial · 0.49mm/px · z∈[+21,+146]mm · 7 of 35 slices shown, 9 images]
[im 5/35  brain]
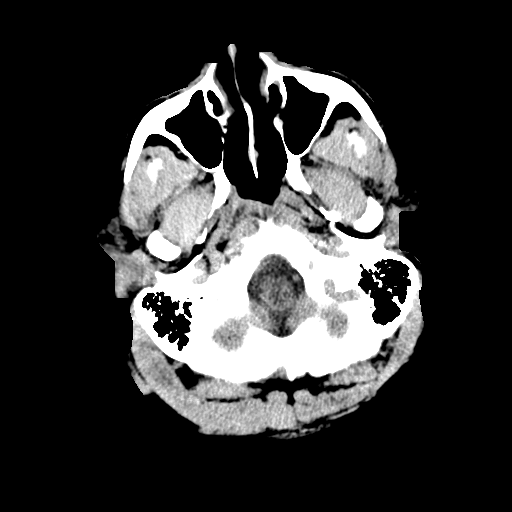
[im 5/35  bone]
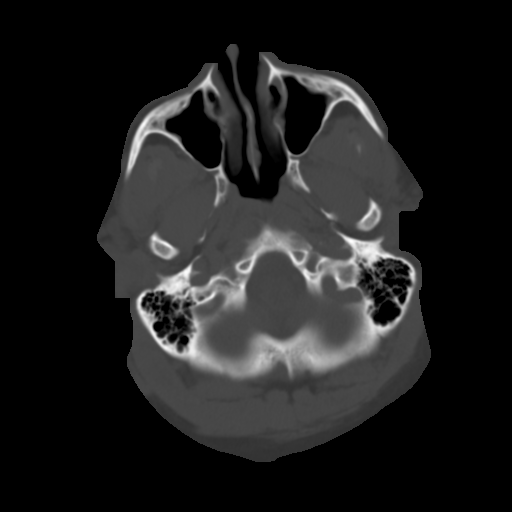
[im 9/35  brain]
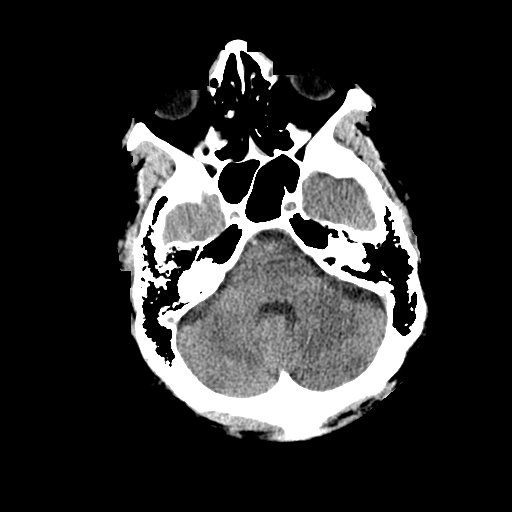
[im 13/35  brain]
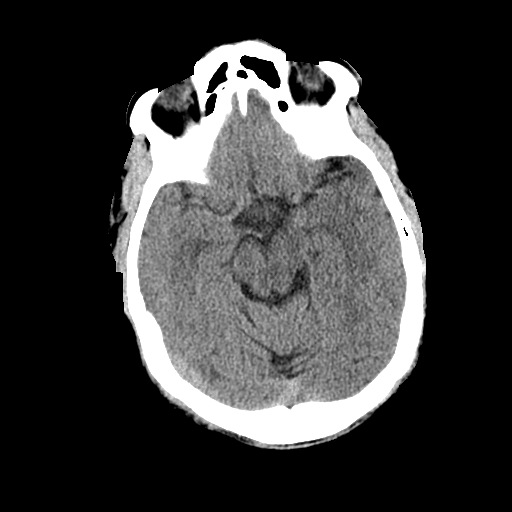
[im 18/35  brain]
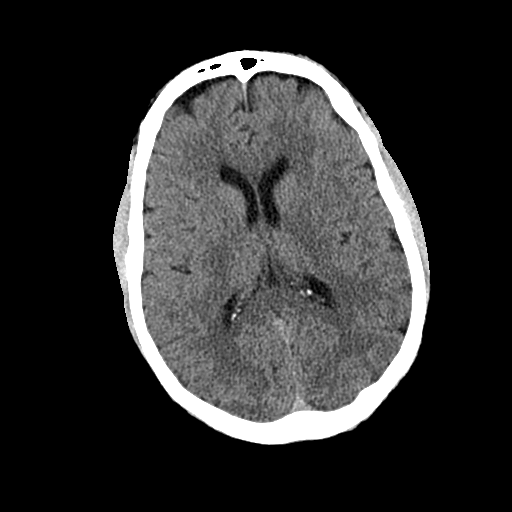
[im 22/35  brain]
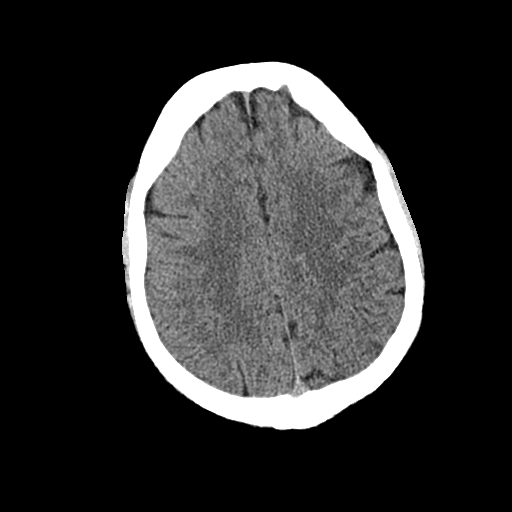
[im 22/35  bone]
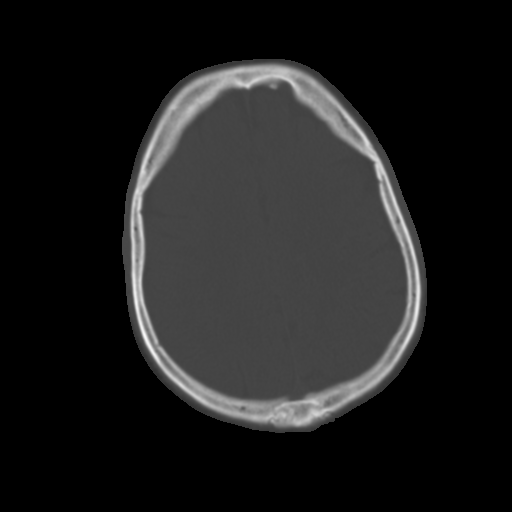
[im 26/35  brain]
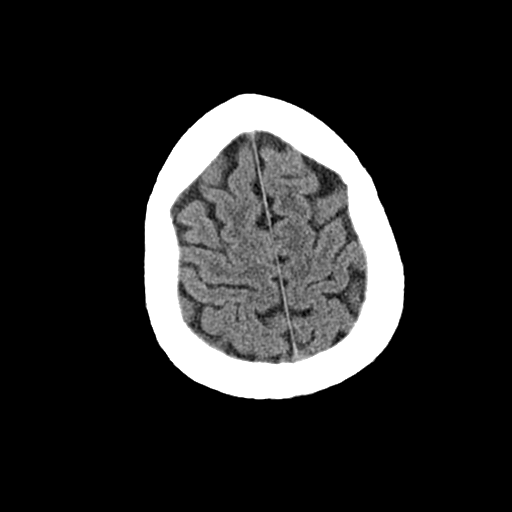
[im 30/35  brain]
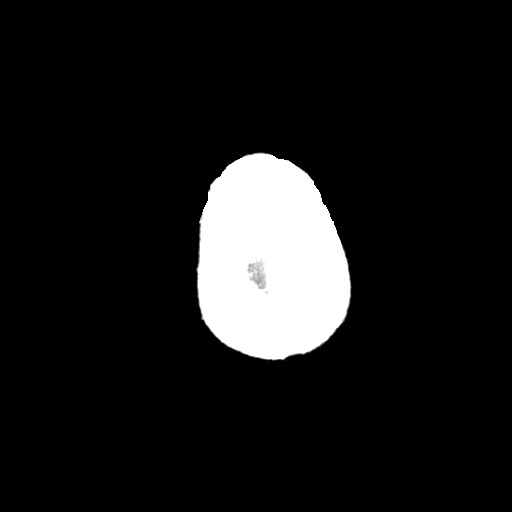

[Series 3: head bone · axial · 0.49mm/px · z∈[+17,+53]mm · 3 of 88 slices shown]
[im 9/88  bone]
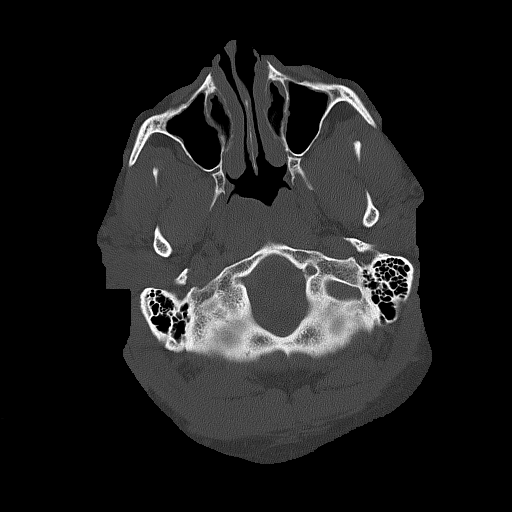
[im 18/88  bone]
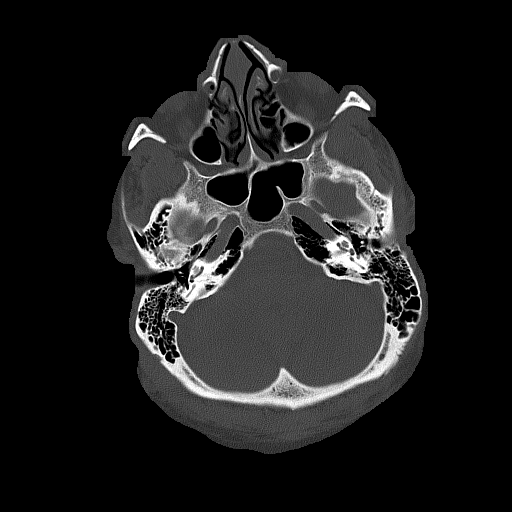
[im 27/88  bone]
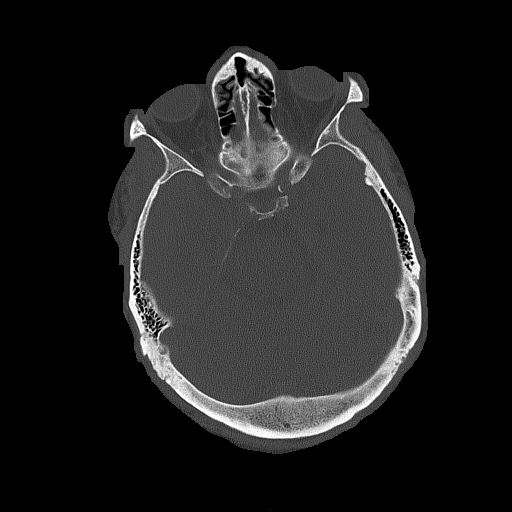

[Series 4: cor soft · coronal · 0.40mm/px · 3 of 84 slices shown]
[im 28/84  brain]
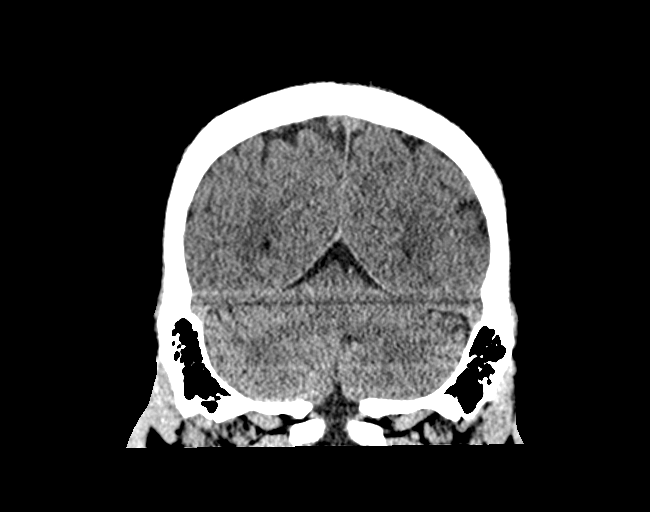
[im 37/84  brain]
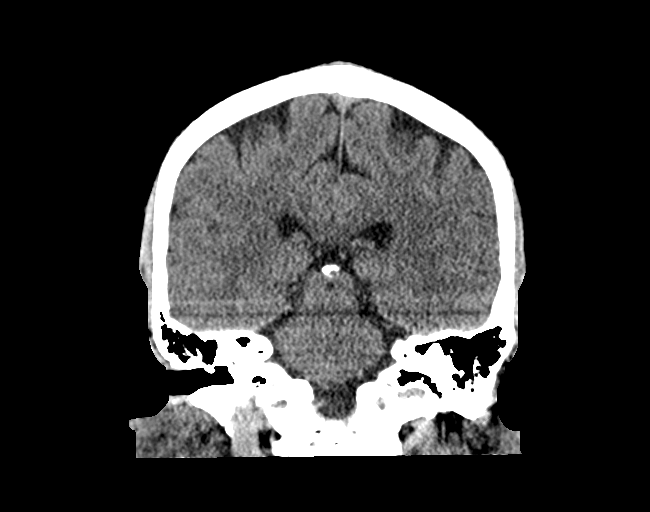
[im 47/84  brain]
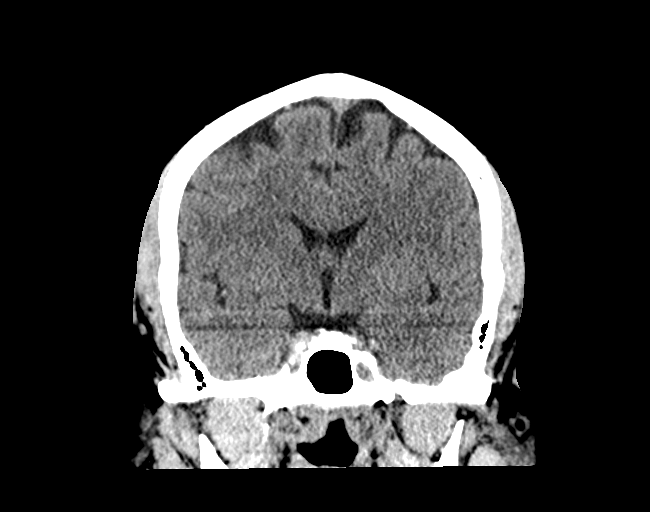

[Series 5: sag soft · sagittal · 0.40mm/px · 3 of 87 slices shown]
[im 29/87  brain]
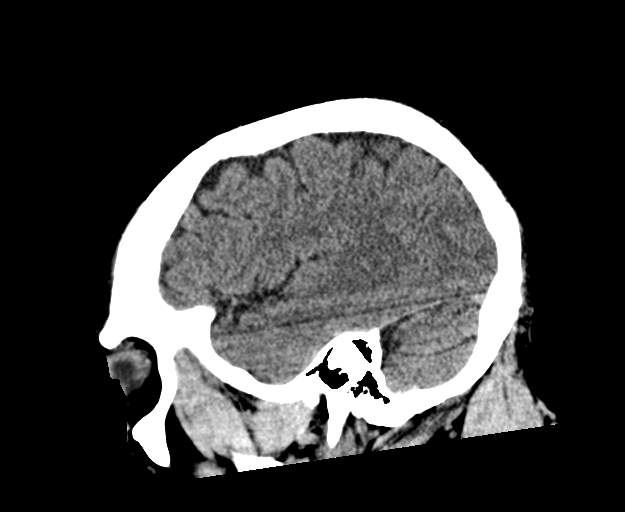
[im 44/87  brain]
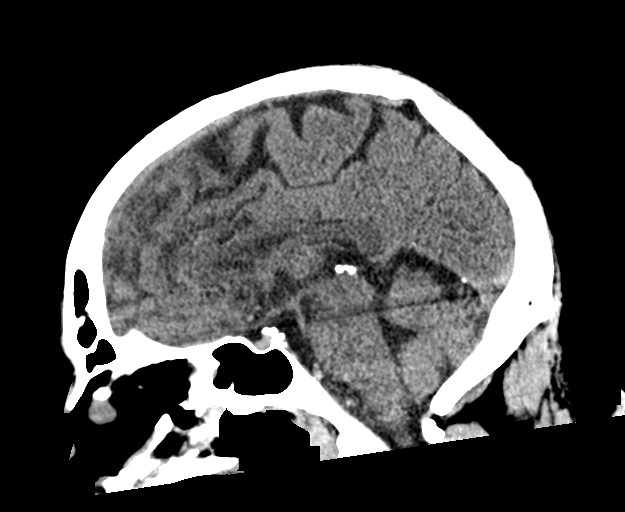
[im 58/87  brain]
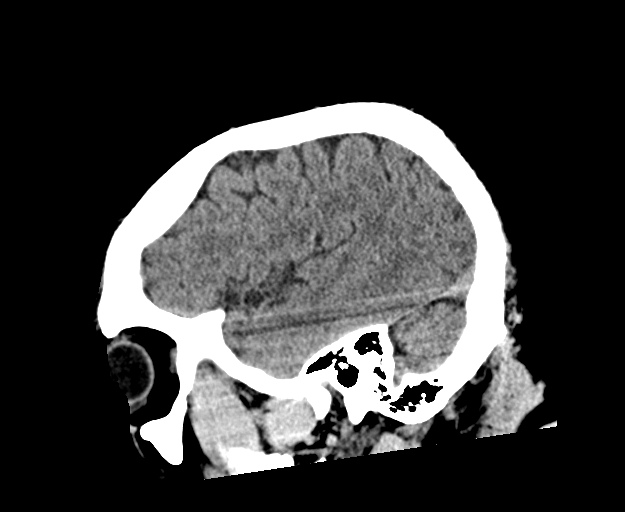

[16 of 47 positions shown; findings below may reference images not displayed]

FINDINGS: Brain: No evidence of acute infarction, hemorrhage, hydrocephalus,
extra-axial collection or mass lesion/mass effect.

Vascular: No hyperdense vessel or unexpected calcification.

Skull: Negative

Sinuses/Orbits: Nonspecific gaze to the left. No mastoid or middle
ear opacification. Remote medial orbit blowout fractures on the left
more than right.

Other: These results were called by telephone at the time of
who verbally acknowledged these results.

ASPECTS (Alberta Stroke Program Early CT Score)

Not scored with this history
IMPRESSION: Negative CT of the brain.

## 2021-05-30 ENCOUNTER — Encounter: Payer: Self-pay | Admitting: Radiology

## 2021-05-30 ENCOUNTER — Emergency Department: Payer: Managed Care, Other (non HMO)

## 2021-05-30 ENCOUNTER — Other Ambulatory Visit: Payer: Self-pay

## 2021-05-30 ENCOUNTER — Observation Stay: Payer: Managed Care, Other (non HMO)

## 2021-05-30 ENCOUNTER — Observation Stay
Admission: EM | Admit: 2021-05-30 | Discharge: 2021-05-31 | Disposition: A | Discharge status: Home / Self Care | Payer: Managed Care, Other (non HMO) | Attending: Internal Medicine | Admitting: Internal Medicine

## 2021-05-30 DIAGNOSIS — R7303 Prediabetes: Secondary | ICD-10-CM | POA: Insufficient documentation

## 2021-05-30 DIAGNOSIS — Z8673 Personal history of transient ischemic attack (TIA), and cerebral infarction without residual deficits: Secondary | ICD-10-CM | POA: Diagnosis not present

## 2021-05-30 DIAGNOSIS — E785 Hyperlipidemia, unspecified: Secondary | ICD-10-CM | POA: Diagnosis not present

## 2021-05-30 DIAGNOSIS — R911 Solitary pulmonary nodule: Secondary | ICD-10-CM | POA: Diagnosis present

## 2021-05-30 DIAGNOSIS — Z20822 Contact with and (suspected) exposure to covid-19: Secondary | ICD-10-CM | POA: Diagnosis not present

## 2021-05-30 DIAGNOSIS — Z7902 Long term (current) use of antithrombotics/antiplatelets: Secondary | ICD-10-CM | POA: Diagnosis not present

## 2021-05-30 DIAGNOSIS — Z720 Tobacco use: Secondary | ICD-10-CM | POA: Diagnosis present

## 2021-05-30 DIAGNOSIS — Z96641 Presence of right artificial hip joint: Secondary | ICD-10-CM | POA: Insufficient documentation

## 2021-05-30 DIAGNOSIS — F17210 Nicotine dependence, cigarettes, uncomplicated: Secondary | ICD-10-CM | POA: Diagnosis not present

## 2021-05-30 DIAGNOSIS — I200 Unstable angina: Principal | ICD-10-CM | POA: Insufficient documentation

## 2021-05-30 DIAGNOSIS — Z8546 Personal history of malignant neoplasm of prostate: Secondary | ICD-10-CM | POA: Insufficient documentation

## 2021-05-30 DIAGNOSIS — R079 Chest pain, unspecified: Secondary | ICD-10-CM | POA: Diagnosis present

## 2021-05-30 DIAGNOSIS — E669 Obesity, unspecified: Secondary | ICD-10-CM | POA: Diagnosis present

## 2021-05-30 DIAGNOSIS — C61 Malignant neoplasm of prostate: Secondary | ICD-10-CM | POA: Diagnosis present

## 2021-05-30 DIAGNOSIS — Z79899 Other long term (current) drug therapy: Secondary | ICD-10-CM | POA: Insufficient documentation

## 2021-05-30 DIAGNOSIS — I639 Cerebral infarction, unspecified: Secondary | ICD-10-CM | POA: Diagnosis present

## 2021-05-30 DIAGNOSIS — Z6841 Body Mass Index (BMI) 40.0 and over, adult: Secondary | ICD-10-CM

## 2021-05-30 LAB — BASIC METABOLIC PANEL
Anion gap: 8 (ref 5–15)
BUN: 14 mg/dL (ref 6–20)
CO2: 23 mmol/L (ref 22–32)
Calcium: 9.1 mg/dL (ref 8.9–10.3)
Chloride: 108 mmol/L (ref 98–111)
Creatinine, Ser: 0.89 mg/dL (ref 0.61–1.24)
GFR, Estimated: >60 mL/min (ref >60)
Glucose, Bld: 112 mg/dL — ABNORMAL HIGH (ref 70–99)
Potassium: 4.2 mmol/L (ref 3.5–5.1)
Sodium: 139 mmol/L (ref 135–145)

## 2021-05-30 LAB — URINE DRUG SCREEN, QUALITATIVE (ARMC ONLY)
Amphetamines, Ur Screen: NOT DETECTED
Barbiturates, Ur Screen: NOT DETECTED
Benzodiazepine, Ur Scrn: NOT DETECTED
Cannabinoid 50 Ng, Ur ~~LOC~~: POSITIVE — AB
Cocaine Metabolite,Ur ~~LOC~~: NOT DETECTED
MDMA (Ecstasy)Ur Screen: NOT DETECTED
Methadone Scn, Ur: NOT DETECTED
Opiate, Ur Screen: NOT DETECTED
Phencyclidine (PCP) Ur S: NOT DETECTED
Tricyclic, Ur Screen: NOT DETECTED

## 2021-05-30 LAB — CBC
HCT: 43.8 % (ref 39.0–52.0)
Hemoglobin: 14.7 g/dL (ref 13.0–17.0)
MCH: 31.4 pg (ref 26.0–34.0)
MCHC: 33.6 g/dL (ref 30.0–36.0)
MCV: 93.6 fL (ref 80.0–100.0)
Platelets: 204 10*3/uL (ref 150–400)
RBC: 4.68 MIL/uL (ref 4.22–5.81)
RDW: 14 % (ref 11.5–15.5)
WBC: 5.6 10*3/uL (ref 4.0–10.5)
nRBC: 0 % (ref 0.0–0.2)

## 2021-05-30 LAB — HEMOGLOBIN A1C
Hgb A1c MFr Bld: 5.6 % (ref 4.8–5.6)
Mean Plasma Glucose: 114.02 mg/dL

## 2021-05-30 LAB — TROPONIN I (HIGH SENSITIVITY)
Troponin I (High Sensitivity): 10 ng/L (ref <18)
Troponin I (High Sensitivity): 8 ng/L (ref <18)
Troponin I (High Sensitivity): 10 ng/L (ref <18)
Troponin I (High Sensitivity): 10 ng/L (ref <18)

## 2021-05-30 LAB — HIV ANTIBODY (ROUTINE TESTING W REFLEX): HIV Screen 4th Generation wRfx: NONREACTIVE

## 2021-05-30 LAB — PROTIME-INR
INR: 0.9 (ref 0.8–1.2)
Prothrombin Time: 12.1 seconds (ref 11.4–15.2)

## 2021-05-30 LAB — RESP PANEL BY RT-PCR (FLU A&B, COVID) ARPGX2
Influenza A by PCR: NEGATIVE
Influenza B by PCR: NEGATIVE
SARS Coronavirus 2 by RT PCR: NEGATIVE

## 2021-05-30 MED ORDER — ENOXAPARIN SODIUM 40 MG/0.4ML IJ SOSY: 40.0000 mg | PREFILLED_SYRINGE | INTRAMUSCULAR | Status: DC

## 2021-05-30 MED ORDER — METOPROLOL SUCCINATE ER 25 MG PO TB24
25.0000 mg | ORAL_TABLET | Freq: Every day | ORAL | Status: DC
  Administered 2021-05-30 – 2021-05-31 (×2): 25 mg via ORAL
  Filled 2021-05-30 (×2): qty 1

## 2021-05-30 MED ORDER — ONDANSETRON HCL 4 MG/2ML IJ SOLN: 4.0000 mg | Freq: Three times a day (TID) | INTRAMUSCULAR | Status: DC | PRN

## 2021-05-30 MED ORDER — ACETAMINOPHEN 325 MG PO TABS: 650.0000 mg | ORAL_TABLET | Freq: Four times a day (QID) | ORAL | Status: DC | PRN

## 2021-05-30 MED ORDER — POLYVINYL ALCOHOL 1.4 % OP SOLN
Freq: Three times a day (TID) | OPHTHALMIC | Status: DC | PRN
  Filled 2021-05-30: qty 15

## 2021-05-30 MED ORDER — TECHNETIUM TC 99M TETROFOSMIN IV KIT
10.0000 | PACK | Freq: Once | INTRAVENOUS | Status: AC | PRN
  Administered 2021-05-30: 10.79 via INTRAVENOUS

## 2021-05-30 MED ORDER — IOHEXOL 350 MG/ML SOLN
100.0000 mL | Freq: Once | INTRAVENOUS | Status: AC | PRN
  Administered 2021-05-30: 100 mL via INTRAVENOUS

## 2021-05-30 MED ORDER — ASPIRIN EC 81 MG PO TBEC
81.0000 mg | DELAYED_RELEASE_TABLET | Freq: Every day | ORAL | Status: DC
  Administered 2021-05-31: 81 mg via ORAL
  Filled 2021-05-30: qty 1

## 2021-05-30 MED ORDER — METOPROLOL SUCCINATE ER 25 MG PO TB24: 25.0000 mg | ORAL_TABLET | Freq: Every day | ORAL | Status: DC

## 2021-05-30 MED ORDER — REGADENOSON 0.4 MG/5ML IV SOLN
0.4000 mg | Freq: Once | INTRAVENOUS | Status: AC
  Administered 2021-05-30: 0.4 mg via INTRAVENOUS
  Filled 2021-05-30: qty 5

## 2021-05-30 MED ORDER — ASPIRIN 81 MG PO CHEW
324.0000 mg | CHEWABLE_TABLET | Freq: Once | ORAL | Status: AC
  Administered 2021-05-30: 324 mg via ORAL
  Filled 2021-05-30: qty 4

## 2021-05-30 MED ORDER — MORPHINE SULFATE (PF) 2 MG/ML IV SOLN: 2.0000 mg | INTRAVENOUS | Status: DC | PRN

## 2021-05-30 MED ORDER — NICOTINE 21 MG/24HR TD PT24
21.0000 mg | MEDICATED_PATCH | Freq: Every day | TRANSDERMAL | Status: DC
  Filled 2021-05-30 (×2): qty 1

## 2021-05-30 MED ORDER — TECHNETIUM TC 99M TETROFOSMIN IV KIT
31.9200 | PACK | Freq: Once | INTRAVENOUS | Status: AC | PRN
  Administered 2021-05-30: 31.92 via INTRAVENOUS

## 2021-05-30 MED ORDER — HYDRALAZINE HCL 20 MG/ML IJ SOLN
5.0000 mg | INTRAMUSCULAR | Status: DC | PRN
  Administered 2021-05-30: 5 mg via INTRAVENOUS
  Filled 2021-05-30: qty 1

## 2021-05-30 MED ORDER — ATORVASTATIN CALCIUM 80 MG PO TABS
80.0000 mg | ORAL_TABLET | Freq: Every day | ORAL | Status: DC
  Administered 2021-05-30: 80 mg via ORAL
  Filled 2021-05-30: qty 1

## 2021-05-30 MED ORDER — ENOXAPARIN SODIUM 60 MG/0.6ML IJ SOSY
0.5000 mg/kg | PREFILLED_SYRINGE | INTRAMUSCULAR | Status: DC
  Administered 2021-05-30 – 2021-05-31 (×2): 57.5 mg via SUBCUTANEOUS
  Filled 2021-05-30 (×2): qty 0.6

## 2021-05-30 MED ORDER — SODIUM CHLORIDE 0.9 % IV SOLN: INTRAVENOUS | Status: DC

## 2021-05-30 MED ORDER — ATORVASTATIN CALCIUM 20 MG PO TABS
40.0000 mg | ORAL_TABLET | Freq: Every day | ORAL | Status: DC
Start: 2021-05-30 — End: 2021-05-30

## 2021-05-30 NOTE — ED Triage Notes (Signed)
Pt states that he started having sharp central chest pain during physical activity this morning. Pt does state he took a sex enhancement supplement today

## 2021-05-30 NOTE — ED Notes (Signed)
RN notified Kathleen Argue that pt is being taken to nuc med from ED than will be taken to Rm 237 after test.

## 2021-05-30 NOTE — Progress Notes (Signed)
PHARMACIST - PHYSICIAN COMMUNICATION  CONCERNING:  Enoxaparin (Lovenox) for DVT Prophylaxis    RECOMMENDATION: Patient was prescribed enoxaprin 40mg  q24 hours for VTE prophylaxis.   Filed Weights   05/30/21 0531  Weight: 115.4 kg (254 lb 6.6 oz)    Body mass index is 39.84 kg/m.  Estimated Creatinine Clearance: 112.5 mL/min (by C-G formula based on SCr of 0.89 mg/dL).   Based on [place] patient is candidate for enoxaparin 0.5mg /kg TBW SQ every 24 hours based on BMI being >30.   DESCRIPTION: Pharmacy has adjusted enoxaparin dose per [facility] policy.  Patient is now receiving enoxaparin 57.5 mg every 24 hours   Pernell Dupre, PharmD, BCPS Clinical Pharmacist 05/30/2021 8:43 AM

## 2021-05-30 NOTE — Plan of Care (Signed)

## 2021-05-30 NOTE — ED Notes (Signed)
Brandon RN aware of assigned bed °

## 2021-05-30 NOTE — H&P (Signed)
History and Physical    NICHOALS HEYDE Shaw:[id number] DOB: [birth year] DOA: 05/30/2021  Referring MD/NP/PA:   PCP: Bradley Castle, MD   Patient coming from:  The patient is coming from home.  At baseline, pt is independent for most of ADL.        Chief Complaint: chest pain  HPI: Bradley Shaw is a 56 y.o. male with medical history significant of stroke, hyperlipidemia, prostate cancer, tobacco abuse, obesity, who presents with chest pain  Patient states he has had about 5 episodes of chest pain in the past 3 days.  His chest pain is exertional, it usually resolves after having rest. It occurred while having intercourse with his wife at one time. This morning the patient was getting ready to go to work and the pain occurred again.  The chest pain is located in substernal area, pressure-like, 8 out of 10 in severity initially, currently 4 out of 10 in severity, radiating to the left arm, making his left hand tingling.  Patient also has mild shortness of breath and mild dry cough.  No fever or chills.  Denies nausea, vomiting, diarrhea or abdominal pain.  No symptoms of UTI.  Currently no unilateral numbness or tinglings in extremities.  No facial droop or slurred speech.  Patient states that he used sexual enhancement.   Data Reviewed and ED Course: pt was found to have troponin level 10 -->8, INR 0.9, WBC 5.6, pending COVID PCR, GFR > 60, temperature normal, blood pressure 159/93, heart rate 94, 78, RR 20, oxygen saturation 98% on room air.  Chest x-ray negative.  CT angiogram of chest/abdomen/pelvis is negative for dissection, but showed aortic atherosclerosis and CAD, also showed several small lung nodule.  Patient is placed on telemetry bed for obs. Dr. Nehemiah Shaw of cardiology is consulted.  CTA: 1. Negative for aortic aneurysm or dissection. Positive for Aortic Atherosclerosis (ICD10-I70.0), coronary artery atherosclerosis.   2. No acute or inflammatory process identified in the  chest, abdomen, or pelvis. Left nephrolithiasis. Thoracic spine degeneration. Chronic L5 pars fractures and severe L3-L4 facet arthropathy. Hip degeneration, previous right hip arthroplasty.   3. Several small pulmonary nodules. Most severe: 4 mm right solid pulmonary nodule. No routine follow-up imaging is recommended per Fleischner Society Guidelines. These guidelines do not apply to immunocompromised patients and patients with cancer. Follow up in patients with significant comorbidities as clinically warranted. For lung cancer screening, adhere to Lung-RADS guidelines. Reference: Radiology. 2017; 284(1):228-43.   EKG: I have personally reviewed.  Sinus rhythm, QTc 410, LAD, early R wave progression  Review of Systems:   General: no fevers, chills, no body weight gain, has fatigue HEENT: no blurry vision, hearing changes or sore throat Respiratory: has dyspnea, coughing, no wheezing CV: has chest pain, no palpitations GI: no nausea, vomiting, abdominal pain, diarrhea, constipation GU: no dysuria, burning on urination, increased urinary frequency, hematuria  Ext: no leg edema Neuro: no unilateral weakness, numbness, or tingling, no vision change or hearing loss Skin: no rash, no skin tear. MSK: No muscle spasm, no deformity, no limitation of range of movement in spin Heme: No easy bruising.  Travel history: No recent long distant travel.   Allergy:  Allergies  Allergen Reactions   Shellfish Allergy Anaphylaxis    Hives & throat closes    Past Medical History:  Diagnosis Date   Prostate cancer [facility])     Past Surgical History:  Procedure Laterality Date   HIP SURGERY Right 2015   JOINT REPLACEMENT  Right    hip   PROSTATE BIOPSY     RADIOACTIVE SEED IMPLANT N/A 08/20/2016   Procedure: RADIOACTIVE SEED IMPLANT/BRACHYTHERAPY IMPLANT;  Surgeon: Hollice Espy, MD;  Location: [facility] ORS;  Service: Urology;  Laterality: N/A;   TONSILLECTOMY      Social History:   reports that he has been smoking cigarettes. He has been smoking an average of .25 packs per day. He has never used smokeless tobacco. He reports current alcohol use. He reports current drug use. Drug: Marijuana.  Family History:  Family History  Problem Relation Age of Onset   Prostate cancer Neg Hx    Bladder Cancer Neg Hx    Kidney cancer Neg Hx      Prior to Admission medications   Medication Sig Start Date End Date Taking? Authorizing Provider  acetaminophen (TYLENOL) 500 MG tablet Take 1,000 mg by mouth as needed.   Yes [provider]  aspirin EC 81 MG EC tablet Take 1 tablet (81 mg total) by mouth daily. 05/02/19  Yes Rinehuls, Early Chars, PA-C  Hyprom-Naphaz-Polysorb-Zn Sulf (CLEAR EYES COMPLETE OP) Place 1 drop into both eyes 3 (three) times daily as needed (dry eyes).   Yes [provider]  atorvastatin (LIPITOR) 80 MG tablet Take 1 tablet (80 mg total) by mouth daily at 6 PM. Patient not taking: Reported on 05/30/2021 05/02/19   Rinehuls, Early Chars, PA-C  clopidogrel (PLAVIX) 75 MG tablet Take 75 mg by mouth daily. Patient not taking: Reported on 05/30/2021 07/21/19   [provider]  sildenafil (VIAGRA) 25 MG tablet Take 1 tablet (25 mg total) by mouth daily as needed for erectile dysfunction. 08/27/19   Noreene Filbert, MD  tamsulosin (FLOMAX) 0.4 MG CAPS capsule TAKE 1 CAPSULE (0.4 MG TOTAL) BY MOUTH DAILY AFTER SUPPER. Patient not taking: Reported on 05/30/2021 12/20/16   Noreene Filbert, MD    Physical Exam: Vitals:   05/30/21 0830 05/30/21 0908 05/30/21 0930 05/30/21 1049  BP: (!) 143/90 (!) 166/90 (!) 149/102 136/80  Pulse: 76   71  Resp: 17   17  Temp:      TempSrc:      SpO2: 95%  98% 96%  Weight:      Height: 5' 7.01" (1.702 m)      General: Not in acute distress HEENT:       Eyes: PERRL, EOMI, no scleral icterus.       ENT: No discharge from the ears and nose, no pharynx injection, no tonsillar enlargement.        Neck: No JVD, no bruit, no  mass felt. Heme: No neck lymph node enlargement. Cardiac: S1/S2, RRR, No murmurs, No gallops or rubs. Respiratory: No rales, wheezing, rhonchi or rubs. GI: Soft, nondistended, nontender, no rebound pain, no organomegaly, BS present. GU: No hematuria Ext: No pitting leg edema bilaterally. 1+DP/PT pulse bilaterally. Musculoskeletal: No joint deformities, No joint redness or warmth, no limitation of ROM in spin. Skin: No rashes.  Neuro: Alert, oriented X3, cranial nerves II-XII grossly intact, moves all extremities normally.  Psych: Patient is not psychotic, no suicidal or hemocidal ideation.  Labs on Admission: I have personally reviewed following labs and imaging studies  CBC: Recent Labs  Lab 05/30/21 0536  WBC 5.6  HGB 14.7  HCT 43.8  MCV 93.6  PLT 885   Basic Metabolic Panel: Recent Labs  Lab 05/30/21 0536  NA 139  K 4.2  CL 108  CO2 23  GLUCOSE 112*  BUN 14  CREATININE 0.89  CALCIUM 9.1   GFR: Estimated Creatinine Clearance: 112.5 mL/min (by C-G formula based on SCr of 0.89 mg/dL). Liver Function Tests: No results for input(s): AST, ALT, ALKPHOS, BILITOT, PROT, ALBUMIN in the last 168 hours. No results for input(s): LIPASE, AMYLASE in the last 168 hours. No results for input(s): AMMONIA in the last 168 hours. Coagulation Profile: Recent Labs  Lab 05/30/21 0536  INR 0.9   Cardiac Enzymes: No results for input(s): CKTOTAL, CKMB, CKMBINDEX, TROPONINI in the last 168 hours. BNP (last 3 results) No results for input(s): PROBNP in the last 8760 hours. HbA1C: No results for input(s): HGBA1C in the last 72 hours. CBG: No results for input(s): GLUCAP in the last 168 hours. Lipid Profile: No results for input(s): CHOL, HDL, LDLCALC, TRIG, CHOLHDL, LDLDIRECT in the last 72 hours. Thyroid Function Tests: No results for input(s): TSH, T4TOTAL, FREET4, T3FREE, THYROIDAB in the last 72 hours. Anemia Panel: No results for input(s): VITAMINB12, FOLATE, FERRITIN, TIBC,  IRON, RETICCTPCT in the last 72 hours. Urine analysis:    Component Value Date/Time   COLORURINE YELLOW 05/01/2019 1212   APPEARANCEUR CLEAR 05/01/2019 1212   APPEARANCEUR Clear 01/11/2016 1331   LABSPEC >1.046 (H) 05/01/2019 1212   LABSPEC 1.012 03/24/2014 0904   PHURINE 5.0 05/01/2019 1212   GLUCOSEU NEGATIVE 05/01/2019 1212   GLUCOSEU Negative 03/24/2014 0904   HGBUR SMALL (A) 05/01/2019 1212   BILIRUBINUR NEGATIVE 05/01/2019 1212   BILIRUBINUR Negative 01/11/2016 1331   BILIRUBINUR Negative 03/24/2014 0904   KETONESUR NEGATIVE 05/01/2019 1212   PROTEINUR 30 (A) 05/01/2019 1212   NITRITE NEGATIVE 05/01/2019 1212   LEUKOCYTESUR NEGATIVE 05/01/2019 1212   LEUKOCYTESUR Negative 03/24/2014 0904   Sepsis Labs: @LABRCNTIP (procalcitonin:4,lacticidven:4) ) Recent Results (from the past 240 hour(s))  Resp Panel by RT-PCR (Flu A&B, Covid) Nasopharyngeal Swab     Status: None   Collection Time: 05/30/21  9:12 AM   Specimen: Nasopharyngeal Swab; Nasopharyngeal(NP) swabs in vial transport medium  Result Value Ref Range Status   SARS Coronavirus 2 by RT PCR NEGATIVE NEGATIVE Final    Comment: (NOTE) SARS-CoV-2 target nucleic acids are NOT DETECTED.  The SARS-CoV-2 RNA is generally detectable in upper respiratory specimens during the acute phase of infection. The lowest concentration of SARS-CoV-2 viral copies this assay can detect is 138 copies/mL. A negative result does not preclude SARS-Cov-2 infection and should not be used as the sole basis for treatment or other patient management decisions. A negative result may occur with  improper specimen collection/handling, submission of specimen other than nasopharyngeal swab, presence of viral mutation(s) within the areas targeted by this assay, and inadequate number of viral copies(<138 copies/mL). A negative result must be combined with clinical observations, patient history, and epidemiological information. The expected result is  Negative.  Fact Sheet for Patients:  EntrepreneurPulse.com.au  Fact Sheet for Healthcare Providers:  IncredibleEmployment.be  This test is no t yet approved or cleared by the [place] FDA and  has been authorized for detection and/or diagnosis of SARS-CoV-2 by FDA under an Emergency Use Authorization (EUA). This EUA will remain  in effect (meaning this test can be used) for the duration of the COVID-19 declaration under Section 564(b)(1) of the Act, 21 U.S.C.section 360bbb-3(b)(1), unless the authorization is terminated  or revoked sooner.       Influenza A by PCR NEGATIVE NEGATIVE Final   Influenza B by PCR NEGATIVE NEGATIVE Final    Comment: (NOTE) The Xpert Xpress SARS-CoV-2/FLU/RSV plus assay is intended as  an aid in the diagnosis of influenza from Nasopharyngeal swab specimens and should not be used as a sole basis for treatment. Nasal washings and aspirates are unacceptable for Xpert Xpress SARS-CoV-2/FLU/RSV testing.  Fact Sheet for Patients: EntrepreneurPulse.com.au  Fact Sheet for Healthcare Providers: IncredibleEmployment.be  This test is not yet approved or cleared by the [place] FDA and has been authorized for detection and/or diagnosis of SARS-CoV-2 by FDA under an Emergency Use Authorization (EUA). This EUA will remain in effect (meaning this test can be used) for the duration of the COVID-19 declaration under Section 564(b)(1) of the Act, 21 U.S.C. section 360bbb-3(b)(1), unless the authorization is terminated or revoked.  Performed at [facility], [address] [place], [place] [address]      Radiological Exams on Admission: DG Chest Portable 1 View  Result Date: 05/30/2021 CLINICAL DATA:  Chest pain. EXAM: PORTABLE CHEST 1 VIEW COMPARISON:  None. FINDINGS: The heart size and mediastinal contours are within normal limits. Aortic atherosclerotic calcifications  noted. Both lungs are clear. The visualized skeletal structures are unremarkable. IMPRESSION: No active disease. Electronically Signed   By: Kerby Moors M.D.   On: 05/30/2021 06:17   CT Angio Chest/Abd/Pel for Dissection W and/or Wo Contrast  Result Date: 05/30/2021 CLINICAL DATA:  56 year old male with chest and back pain during physical activity this morning. EXAM: CT ANGIOGRAPHY CHEST, ABDOMEN AND PELVIS TECHNIQUE: Non-contrast CT of the chest was initially obtained. Multidetector CT imaging through the chest, abdomen and pelvis was performed using the standard protocol during bolus administration of intravenous contrast. Multiplanar reconstructed images and MIPs were obtained and reviewed to evaluate the vascular anatomy. RADIATION DOSE REDUCTION: This exam was performed according to the departmental dose-optimization program which includes automated exposure control, adjustment of the mA and/or kV according to patient size and/or use of iterative reconstruction technique. CONTRAST:  174mL OMNIPAQUE IOHEXOL 350 MG/ML SOLN COMPARISON:  Portable chest 0559 hours. FINDINGS: CTA CHEST FINDINGS Cardiovascular: Calcified coronary artery atherosclerosis. Calcified aortic atherosclerosis. Negative for thoracic aortic dissection or aneurysm. Three vessel arch configuration, proximal great vessels appear patent. Central pulmonary arteries appear patent. No cardiomegaly or pericardial effusion. Mediastinum/Nodes: Negative. No mediastinal mass or lymphadenopathy. Lungs/Pleura: Major airways are patent. Lung volumes appear normal. Tiny 3 mm left lung subpleural nodule on series 6, image 82. Minor bilateral linear lower lobe scarring or atelectasis. Small right middle lobe 4 mm lung nodule best seen on series 7, image 67. No pleural effusion or evidence of acute lung inflammation. Musculoskeletal: Severe upper thoracic disc and endplate degeneration with degenerative endplate sclerosis T3 through T5. No acute osseous  abnormality identified. Review of the MIP images confirms the above findings. CTA ABDOMEN AND PELVIS FINDINGS VASCULAR Aortoiliac calcified atherosclerosis. With combined soft and calcified atherosclerosis throughout. Negative for abdominal aortic aneurysm or dissection. Major aortic branches remain patent. Bilateral iliac and proximal femoral arteries remain patent despite atherosclerosis. Review of the MIP images confirms the above findings. NON-VASCULAR Hepatobiliary: Negative liver and gallbladder. Pancreas: Negative. Spleen: Negative. Adrenals/Urinary Tract: Normal adrenal glands. 2-3 mm left renal midpole calculus. But no hydronephrosis. Symmetric renal enhancement. No pararenal inflammation. No hydroureter. Diminutive bladder. Stomach/Bowel: Decompressed large bowel from the splenic flexure to the rectum. Mild upstream large bowel retained stool. Normal appendix on series 5, image 157. No large bowel inflammation. No dilated small bowel. Mostly decompressed stomach and duodenum. No free air, free fluid, mesenteric inflammation. Lymphatic: No lymphadenopathy identified. Reproductive: Sequelae of prostate brachytherapy. Other: No pelvic free fluid. Musculoskeletal: Chronic  L5 pars fractures but but no associated spondylolisthesis. Chronic severe lumbar facet arthropathy at L3-L4 with vacuum facet. Previous right hip arthroplasty. Extensive subchondral left acetabular sclerosis combined with subchondral cysts. No acute osseous abnormality identified. Review of the MIP images confirms the above findings. IMPRESSION: 1. Negative for aortic aneurysm or dissection. Positive for Aortic Atherosclerosis (ICD10-I70.0), coronary artery atherosclerosis. 2. No acute or inflammatory process identified in the chest, abdomen, or pelvis. Left nephrolithiasis. Thoracic spine degeneration. Chronic L5 pars fractures and severe L3-L4 facet arthropathy. Hip degeneration, previous right hip arthroplasty. 3. Several small pulmonary  nodules. Most severe: 4 mm right solid pulmonary nodule. No routine follow-up imaging is recommended per Fleischner Society Guidelines. These guidelines do not apply to immunocompromised patients and patients with cancer. Follow up in patients with significant comorbidities as clinically warranted. For lung cancer screening, adhere to Lung-RADS guidelines. Reference: Radiology. 2017; 284(1):228-43. Electronically Signed   By: Genevie Ann M.D.   On: 05/30/2021 07:45      Assessment/Plan Principal Problem:   Chest pain Active Problems:   Prostate Cancer  (Large Volume Moderate Risk)   Stroke (HCC)   Obesity with body mass index (BMI) of 30.0 to 39.9   Tobacco use   HLD (hyperlipidemia)   Lung nodule   Chest pain: Patient had several episode of exertional chest pain in the past several days.  Troponin negative x2 so far.  Concerning for unstable angina.  Dr. Nehemiah Shaw of cardiology is consulted  - place to progressive unit for observation - Trend Trop - prn Morphine - ASA - restart lipitor (pt is not taking it now) - Risk factor stratification: will check FLP and A1C  - check UDS - will not give nitroglycerin since pt used sexual enhancement medications - Lexiscan Myoview test per card  HTN: -IV hydralazine as needed -Started metoprolol 25 mg daily per Card  Prostate Cancer  (Large Volume Moderate Risk): s/p of radiation seeds placement.  He stopped taking Flomax since he does not have symptoms anymore -Follow-up with [facility] urology  Stroke [facility]) -Aspirin and Lipitor  Morbid obesity with BMI of 39.84. -Diet and exercise.   -Encouraged to lose weight.   Tobacco use: -nicotine patch -Data counseling about importance of quitting tobacco use  HLD (hyperlipidemia) -restarted Lipitor  Lung nodule -Follow-up with PCP        DVT ppx: SQ Lovenox  Code Status: Full code  Family Communication: not done, no family member is at bed side.  I ordered to call his wife, but pt did  not let me to since he does not want me to interrupt his wife's work  Disposition Plan:  Anticipate discharge back to previous environment  Consults called: Dr. Nehemiah Shaw of cardiology  Admission status and Level of care: Telemetry Cardiac:   for obs     Severity of Illness:  The appropriate patient status for this patient is OBSERVATION. Observation status is judged to be reasonable and necessary in order to provide the required intensity of service to ensure the patient's safety. The patient's presenting symptoms, physical exam findings, and initial radiographic and laboratory data in the context of their medical condition is felt to place them at decreased risk for further clinical deterioration. Furthermore, it is anticipated that the patient will be medically stable for discharge from the hospital within 2 midnights of admission.        Date of Service 05/30/2021    Ivor Costa [facility]   If 7PM-7AM, please contact night-coverage [email or web address] 05/30/2021,  12:58 PM

## 2021-05-30 NOTE — ED Provider Notes (Signed)
[facility] Provider Note    Event Date/Time   First MD Initiated Contact with Patient 05/30/21 [phone]     (approximate)   History   Chest Pain   HPI  Bradley Shaw is a 56 y.o. male  with a history of remote prostate cancer, CVA on Plavix who presents for evaluation of CP. Patient reports approximately 5 episodes of chest pain in the last 3 days. The first one started 3 days while having intercourse with his wife. He reports a pressure like pain in the center of his chest associated with a sharp stabbing component and numbness of the LUE. Once he stopped and rested, the pain went away. This morning, he was getting ready to go to work and the pain happened again, this time more severe which prompted his visit to the ER. The pain has now subsided. Patient denies any prior history of heart disease, or DVT, recent travel immobilization, leg pain or swelling, hemoptysis or exogenous hormones.      Past Medical History:  Diagnosis Date   Prostate cancer [facility])     Past Surgical History:  Procedure Laterality Date   HIP SURGERY Right 2015   JOINT REPLACEMENT Right    hip   PROSTATE BIOPSY     RADIOACTIVE SEED IMPLANT N/A 08/20/2016   Procedure: RADIOACTIVE SEED IMPLANT/BRACHYTHERAPY IMPLANT;  Surgeon: Hollice Espy, MD;  Location: [facility] ORS;  Service: Urology;  Laterality: N/A;   TONSILLECTOMY       Physical Exam   Triage Vital Signs: ED Triage Vitals  Enc Vitals Group     BP --      Pulse Rate 05/30/21 0533 94     Resp 05/30/21 0533 18     Temp 05/30/21 0533 98.5 F (36.9 C)     Temp Source 05/30/21 0533 Oral     SpO2 05/30/21 0533 98 %     Weight 05/30/21 0531 254 lb 6.6 oz (115.4 kg)     Height --      Head Circumference --      Peak Flow --      Pain Score 05/30/21 0530 8     Pain Loc --      Pain Edu? --      Excl. in [place]? --     Most recent vital signs: Vitals:   05/30/21 0533 05/30/21 0630  BP:  (!) 150/100  Pulse: 94 86  Resp:  18 20  Temp: 98.5 F (36.9 C)   SpO2: 98% 97%     Constitutional: Alert and oriented. Well appearing and in no apparent distress. HEENT:      Head: Normocephalic and atraumatic.         Eyes: Conjunctivae are normal. Sclera is non-icteric.       Mouth/Throat: Mucous membranes are moist.       Neck: Supple with no signs of meningismus. Cardiovascular: Regular rate and rhythm. No murmurs, gallops, or rubs. 2+ symmetrical distal pulses are present in all extremities.  Respiratory: Normal respiratory effort. Lungs are clear to auscultation bilaterally.  Gastrointestinal: Soft, non tender, and non distended with positive bowel sounds. No rebound or guarding. Genitourinary: No CVA tenderness. Musculoskeletal:  No edema, cyanosis, or erythema of extremities. Neurologic: Normal speech and language. Face is symmetric. Moving all extremities. No gross focal neurologic deficits are appreciated. Skin: Skin is warm, dry and intact. No rash noted. Psychiatric: Mood and affect are normal. Speech and behavior are normal.  ED Results /  Procedures / Treatments   Labs (all labs ordered are listed, but only abnormal results are displayed) Labs Reviewed  BASIC METABOLIC PANEL - Abnormal; Notable for the following components:      Result Value   Glucose, Bld 112 (*)    All other components within normal limits  CBC  PROTIME-INR  TROPONIN I (HIGH SENSITIVITY)  TROPONIN I (HIGH SENSITIVITY)     EKG  ED ECG REPORT I, Rudene Re, the attending physician, personally viewed and interpreted this ECG.  Sinus rhythm with a rate of 93, normal intervals, normal axis, inferior Q waves, no ST elevations or depressions.  Unchanged from prior from [place], Rudene Re, attending MD, have personally viewed and interpreted the images obtained during this visit as below:  Chest x-ray with no acute findings   ___________________________________________________ Interpretation by  Radiologist:  DG Chest Portable 1 View  Result Date: 05/30/2021 CLINICAL DATA:  Chest pain. EXAM: PORTABLE CHEST 1 VIEW COMPARISON:  None. FINDINGS: The heart size and mediastinal contours are within normal limits. Aortic atherosclerotic calcifications noted. Both lungs are clear. The visualized skeletal structures are unremarkable. IMPRESSION: No active disease. Electronically Signed   By: Kerby Moors M.D.   On: 05/30/2021 06:17      PROCEDURES:  Critical Care performed: Yes, see critical care procedure note(s)  .Critical Care Performed by: Rudene Re, MD Authorized by: Rudene Re, MD   Critical care provider statement:    Critical care time (minutes):  30   Critical care was necessary to treat or prevent imminent or life-threatening deterioration of the following conditions:  Respiratory failure, circulatory failure, cardiac failure and shock   Critical care was time spent personally by me on the following activities:  Development of treatment plan with patient or surrogate, discussions with consultants, evaluation of patient's response to treatment, examination of patient, ordering and review of laboratory studies, ordering and review of radiographic studies, ordering and performing treatments and interventions, pulse oximetry, re-evaluation of patient's condition and review of old charts   I assumed direction of critical care for this patient from another provider in my specialty: no     Care discussed with: admitting provider      IMPRESSION / MDM / [place] / ED COURSE  I reviewed the triage vital signs and the nursing notes.  56 y.o. male  with a history of remote prostate cancer, CVA on Plavix who presents for evaluation of CP. Patient with several episodes of central chest pressure associated with intermittent sharp component and LUE numbness x 3 days. No pain at this time. Patient is well appearing and in no distress, equal strong pulses x 4, no  tachycardia, tachypnea, or hypoxia.  Ddx: ACS vs dissection vs esophageal spasms vs PE vs myocarditis vs pericardial effusion   Plan: EKG, CBC, metabolic panel, troponin x 2, CTA of the chest, cardiac telemetry for close monitoring of cardiorespiratory status   MEDICATIONS GIVEN IN ED: Medications - No data to display   ED COURSE: EKG with no acute ischemic changes.  First troponin is negative.  Labs showing mild hyperglycemia no evidence of DKA.  History of chest pain concerning for possible cardiac etiology. Exam is benign. EKG and initial cardiac labs are without evidence of ischemia. Clinical picture and evaluation not suggestive of other serious cause including pneumonia, pulmonary embolism, pneumothorax, esophageal rupture, or aortic dissection. Patient will be admitted for chest pain evaluation.  I discussed the diagnosis and plan of care with  the patient and answered all his questions. He states understanding and is in agreement with admission for further work up.  Hospitalist service was consulted and after discussion has accepted patient to their service.   _________________________ 7:13 AM on 05/30/2021 ----------------------------------------- CT pending.  If negative for dissection plan to given aspirin and admitted to the hospitalist service for unstable angina.  Care transferred to Dr. Cheri Fowler    Consults: none   EMR reviewed including patient's last admission to the hospital in 2021 for stroke    FINAL CLINICAL IMPRESSION(S) / ED DIAGNOSES   Final diagnoses:  Unstable angina ([place])     Rx / DC Orders   ED Discharge Orders     None        Note:  This document was prepared using Dragon voice recognition software and may include unintentional dictation errors.   Please note:  Patient was evaluated in Emergency Department today for the symptoms described in the history of present illness. Patient was evaluated in the context of the global COVID-19  pandemic, which necessitated consideration that the patient might be at risk for infection with the SARS-CoV-2 virus that causes COVID-19. Institutional protocols and algorithms that pertain to the evaluation of patients at risk for COVID-19 are in a state of rapid change based on information released by regulatory bodies including the CDC and federal and state organizations. These policies and algorithms were followed during the patient's care in the ED.  Some ED evaluations and interventions may be delayed as a result of limited staffing during the pandemic.       Alfred Levins, [place], MD 05/30/21 [phone]

## 2021-05-30 NOTE — Consult Note (Addendum)
[place] NOTE       Patient ID: WIL SLAPE MRN: [id number] DOB/AGE: 07-12-1965 56 y.o.  Admit date: 05/30/2021 Referring Physician Dr. Ivor Costa Primary Physician Dr. Johny Drilling Primary Cardiologist none Reason for Consultation chest pain  HPI: The patient is a 56 year old male with a past medical history significant for hyperlipidemia, history of TIA 2021, arthritis, history of prostate cancer s/p radiation, prediabetes, current tobacco use who presented to [facility] ED who presented to [facility] ED 05/30/2021 with chest pain.  Patient reports for a number of months he has been getting "winded" with activity and at work.  He said that occasionally he would have some mild sharp pains in his chest associated with this "windedness" but it would resolve with rest.  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  Over the past 3 days he has been having worsening exertional chest pain that initially occurred while the was having intercourse with his wife.  He described the chest pain as sharp stabbing in the center of his chest with associated numbness of his left arm.  Once he stopped and rested the pain went away.  This morning the patient was getting ready to go to work and the pain occurred again and persisted so he presented to the ED.  He thinks he saw a cardiologist during his admission in January 2021 for his TIA, but has not seen on an outpatient basis to his knowledge.  Denies history of heart attack, stent placement or cardiac cath.  He admits to smoking 10 cigarettes/day and has done so for the past 10 years.  Drinks about 3 beers per week and smokes marijuana.  Vitals on admission were notable for elevated blood pressure peaking at 166/90.  SPO2 98% on room air  Labs on admission are notable for a creatinine of 0.89, GFR greater than 60.  Troponin flat 10-8.  H&H 14.7, 43.8, platelets 204.  Review of systems complete and found to be negative unless listed above      Past Medical History:  Diagnosis Date   Prostate cancer [facility])     Past Surgical History:  Procedure Laterality Date   HIP SURGERY Right 2015   JOINT REPLACEMENT Right    hip   PROSTATE BIOPSY     RADIOACTIVE SEED IMPLANT N/A 08/20/2016   Procedure: RADIOACTIVE SEED IMPLANT/BRACHYTHERAPY IMPLANT;  Surgeon: Hollice Espy, MD;  Location: [facility] ORS;  Service: Urology;  Laterality: N/A;   TONSILLECTOMY      (Not in a hospital admission)  Social History   Socioeconomic History   Marital status: Married    Spouse name: Not on file   Number of children: Not on file   Years of education: Not on file   Highest education level: Not on file  Occupational History   Not on file  Tobacco Use   Smoking status: Every Day    Packs/day: 0.25    Types: Cigarettes   Smokeless tobacco: Never  Substance and Sexual Activity   Alcohol use: Yes    Comment: beer occassional   Drug use: Yes    Types: Marijuana   Sexual activity: Yes  Other Topics Concern   Not on file  Social History Narrative   Not on file   Social Determinants of Health   Financial Resource Strain: Not on file  Food Insecurity: Not on file  Transportation Needs: Not on file  Physical Activity: Not on file  Stress: Not on file  Social Connections:  Not on file  Intimate Partner Violence: Not on file    Family History  Problem Relation Age of Onset   Prostate cancer Neg Hx    Bladder Cancer Neg Hx    Kidney cancer Neg Hx       Review of systems complete and found to be negative unless listed above    PHYSICAL EXAM General: Pleasant black male, well nourished, in no acute distress.  Sitting upright in ED stretcher in street clothes. HEENT:  Normocephalic and atraumatic. Neck:  No JVD.  Lungs: Normal respiratory effort on room air. Clear bilaterally to auscultation. No wheezes, crackles, rhonchi.  Heart: HRRR . Normal S1 and S2 without gallops or murmurs. Radial & DP pulses 2+ bilaterally. Abdomen: Obese  appearing.  Msk: Normal strength and tone for age. Extremities: Warm and well perfused. No clubbing, cyanosis.  No lower extremity edema.  Neuro: Alert and oriented X 3. Psych:  Answers questions appropriately.   Labs:   Lab Results  Component Value Date   WBC 5.6 05/30/2021   HGB 14.7 05/30/2021   HCT 43.8 05/30/2021   MCV 93.6 05/30/2021   PLT 204 05/30/2021    Recent Labs  Lab 05/30/21 0536  NA 139  K 4.2  CL 108  CO2 23  BUN 14  CREATININE 0.89  CALCIUM 9.1  GLUCOSE 112*   Lab Results  Component Value Date   CKTOTAL 1,048 (H) 05/01/2019    Lab Results  Component Value Date   CHOL 200 05/02/2019   CHOL 228 (H) 01/08/2017   Lab Results  Component Value Date   HDL 56 05/02/2019   HDL 45 01/08/2017   Lab Results  Component Value Date   LDLCALC 127 (H) 05/02/2019   LDLCALC 157 (H) 01/08/2017   Lab Results  Component Value Date   TRIG 87 05/02/2019   TRIG 130 01/08/2017   Lab Results  Component Value Date   CHOLHDL 3.6 05/02/2019   CHOLHDL 5.1 01/08/2017   No results found for: LDLDIRECT    Radiology: DG Chest Portable 1 View  Result Date: 05/30/2021 CLINICAL DATA:  Chest pain. EXAM: PORTABLE CHEST 1 VIEW COMPARISON:  None. FINDINGS: The heart size and mediastinal contours are within normal limits. Aortic atherosclerotic calcifications noted. Both lungs are clear. The visualized skeletal structures are unremarkable. IMPRESSION: No active disease. Electronically Signed   By: Kerby Moors M.D.   On: 05/30/2021 06:17   CT Angio Chest/Abd/Pel for Dissection W and/or Wo Contrast  Result Date: 05/30/2021 CLINICAL DATA:  56 year old male with chest and back pain during physical activity this morning. EXAM: CT ANGIOGRAPHY CHEST, ABDOMEN AND PELVIS TECHNIQUE: Non-contrast CT of the chest was initially obtained. Multidetector CT imaging through the chest, abdomen and pelvis was performed using the standard protocol during bolus administration of intravenous  contrast. Multiplanar reconstructed images and MIPs were obtained and reviewed to evaluate the vascular anatomy. RADIATION DOSE REDUCTION: This exam was performed according to the departmental dose-optimization program which includes automated exposure control, adjustment of the mA and/or kV according to patient size and/or use of iterative reconstruction technique. CONTRAST:  139mL OMNIPAQUE IOHEXOL 350 MG/ML SOLN COMPARISON:  Portable chest 0559 hours. FINDINGS: CTA CHEST FINDINGS Cardiovascular: Calcified coronary artery atherosclerosis. Calcified aortic atherosclerosis. Negative for thoracic aortic dissection or aneurysm. Three vessel arch configuration, proximal great vessels appear patent. Central pulmonary arteries appear patent. No cardiomegaly or pericardial effusion. Mediastinum/Nodes: Negative. No mediastinal mass or lymphadenopathy. Lungs/Pleura: Major airways are patent. Lung volumes appear normal.  Tiny 3 mm left lung subpleural nodule on series 6, image 82. Minor bilateral linear lower lobe scarring or atelectasis. Small right middle lobe 4 mm lung nodule best seen on series 7, image 67. No pleural effusion or evidence of acute lung inflammation. Musculoskeletal: Severe upper thoracic disc and endplate degeneration with degenerative endplate sclerosis T3 through T5. No acute osseous abnormality identified. Review of the MIP images confirms the above findings. CTA ABDOMEN AND PELVIS FINDINGS VASCULAR Aortoiliac calcified atherosclerosis. With combined soft and calcified atherosclerosis throughout. Negative for abdominal aortic aneurysm or dissection. Major aortic branches remain patent. Bilateral iliac and proximal femoral arteries remain patent despite atherosclerosis. Review of the MIP images confirms the above findings. NON-VASCULAR Hepatobiliary: Negative liver and gallbladder. Pancreas: Negative. Spleen: Negative. Adrenals/Urinary Tract: Normal adrenal glands. 2-3 mm left renal midpole calculus.  But no hydronephrosis. Symmetric renal enhancement. No pararenal inflammation. No hydroureter. Diminutive bladder. Stomach/Bowel: Decompressed large bowel from the splenic flexure to the rectum. Mild upstream large bowel retained stool. Normal appendix on series 5, image 157. No large bowel inflammation. No dilated small bowel. Mostly decompressed stomach and duodenum. No free air, free fluid, mesenteric inflammation. Lymphatic: No lymphadenopathy identified. Reproductive: Sequelae of prostate brachytherapy. Other: No pelvic free fluid. Musculoskeletal: Chronic L5 pars fractures but but no associated spondylolisthesis. Chronic severe lumbar facet arthropathy at L3-L4 with vacuum facet. Previous right hip arthroplasty. Extensive subchondral left acetabular sclerosis combined with subchondral cysts. No acute osseous abnormality identified. Review of the MIP images confirms the above findings. IMPRESSION: 1. Negative for aortic aneurysm or dissection. Positive for Aortic Atherosclerosis (ICD10-I70.0), coronary artery atherosclerosis. 2. No acute or inflammatory process identified in the chest, abdomen, or pelvis. Left nephrolithiasis. Thoracic spine degeneration. Chronic L5 pars fractures and severe L3-L4 facet arthropathy. Hip degeneration, previous right hip arthroplasty. 3. Several small pulmonary nodules. Most severe: 4 mm right solid pulmonary nodule. No routine follow-up imaging is recommended per Fleischner Society Guidelines. These guidelines do not apply to immunocompromised patients and patients with cancer. Follow up in patients with significant comorbidities as clinically warranted. For lung cancer screening, adhere to Lung-RADS guidelines. Reference: Radiology. 2017; 284(1):228-43. Electronically Signed   By: Genevie Ann M.D.   On: 05/30/2021 07:45    ECHO 05/01/2019  1. Left ventricular ejection fraction, by visual estimation, is 60 to  65%. The left ventricle has normal function. There is no left  ventricular  hypertrophy. Normal diastolic function.   2. Global right ventricle has normal systolic function.The right  ventricular size is normal.   3. Left atrial size was normal.   4. Right atrial size was normal.   5. The mitral valve is normal in structure. No evidence of mitral valve  regurgitation.   6. The tricuspid valve is normal in structure.   7. The aortic valve is tricuspid. Aortic valve regurgitation is not  visualized. No evidence of aortic valve sclerosis or stenosis.   8. The pulmonic valve was not well visualized. Pulmonic valve  regurgitation is not visualized.   9. TR signal is inadequate for assessing pulmonary artery systolic  pressure.  10. The inferior vena cava is normal in size with <50% respiratory  variability, suggesting right atrial pressure of 8 mmHg.   TELEMETRY reviewed by me: Sinus rhythm rate 90  EKG reviewed by me: Sinus rhythm rate 93 ?Old inferior infarct, similar in appearance to EKG from 2021.  ASSESSMENT AND PLAN:  The patient is a 56 year old male with a past medical history significant for hyperlipidemia, history  of TIA 2021, arthritis, history of prostate cancer s/p radiation, prediabetes, current tobacco use who presented to [facility] ED who presented to [facility] ED 05/30/2021 with chest pain.  #Unstable angina Patient reports a number of months of "windedness with exertion" and occasional associated central chest pain.  3 days ago while he was having intercourse with his wife he had central chest pain that radiated to his left arm/had arm numbness which terminated with rest.  This pain has been occurring with exertion more frequently over the past 3 days which prompted his presentation to the ED.  EKG is without acute ischemic changes, troponins are flat 10-9.  He certainly has risk factors for coronary artery disease like hyperlipidemia, history of TIA and ongoing tobacco abuse therefore, further cardiac work-up is needed today. -s/p 324 mg of  aspirin.  Continue 81 mg aspirin daily -We will restart atorvastatin 80 mg. -Initiate beta-blockade with metoprolol succinate 25 mg. -Plan for exercise treadmill stress test today, further recommendations based on its results.  -stress test normal without ischemia and therefore ok for ambulation and possible dc to ;home with fu in one week  #History of TIA #Hyperlipidemia #Elevated blood pressure -denies prior history of hypertension, but his BP has been persistently above goal while in the ED at 161W - 960A systolic. Patient reports she has not been taking atorvastatin for some time because "he was feeling good." -Recommend restarting high intensity statin therapy  #Tobacco abuse Patient smokes half pack of cigarettes per day x10 years.  He said he has quit once before in the past and is very motivated to quit now.  Strongly encouraged smoking cessation.  This patient's plan of care was discussed and created with Dr. Nehemiah Massed and he is in agreement.  Signed: Tristan Schroeder , PA-C 05/30/2021, 9:23 AM [facility]  .bsign

## 2021-05-31 DIAGNOSIS — E669 Obesity, unspecified: Secondary | ICD-10-CM

## 2021-05-31 DIAGNOSIS — C61 Malignant neoplasm of prostate: Secondary | ICD-10-CM | POA: Diagnosis not present

## 2021-05-31 DIAGNOSIS — E785 Hyperlipidemia, unspecified: Secondary | ICD-10-CM | POA: Diagnosis not present

## 2021-05-31 DIAGNOSIS — R079 Chest pain, unspecified: Secondary | ICD-10-CM | POA: Diagnosis not present

## 2021-05-31 LAB — NM MYOCAR MULTI W/SPECT W/WALL MOTION / EF
Base ST Depression (mm): 0 mm
Estimated workload: 1
Exercise duration (min): 1 min
Exercise duration (sec): 6 s
LV dias vol: 121 mL (ref 62–150)
LV sys vol: 56 mL
MPHR: 164 {beats}/min
Nuc Stress EF: 54 %
Peak HR: 114 {beats}/min
Percent HR: 69 %
Rest HR: 84 {beats}/min
Rest Nuclear Isotope Dose: 10.8 mCi
SDS: 3
SRS: 1
SSS: 4
ST Depression (mm): 0 mm
Stress Nuclear Isotope Dose: 31.9 mCi
TID: 1.04

## 2021-05-31 LAB — LIPID PANEL
Cholesterol: 212 mg/dL — ABNORMAL HIGH (ref 0–200)
HDL: 54 mg/dL (ref >40)
LDL Cholesterol: 138 mg/dL — ABNORMAL HIGH (ref 0–99)
Total CHOL/HDL Ratio: 3.9 RATIO
Triglycerides: 101 mg/dL (ref <150)
VLDL: 20 mg/dL (ref 0–40)

## 2021-05-31 MED ORDER — ATORVASTATIN CALCIUM 20 MG PO TABS
20.0000 mg | ORAL_TABLET | Freq: Every day | ORAL | 0 refills | Status: DC
Start: 2021-05-31 — End: 2023-07-18

## 2021-05-31 MED ORDER — ATORVASTATIN CALCIUM 20 MG PO TABS: 20.0000 mg | ORAL_TABLET | Freq: Every day | ORAL | 0 refills | Status: DC

## 2021-05-31 NOTE — Progress Notes (Signed)
[place] NOTE       Patient ID: TRAXTON KOLENDA MRN: [id number] DOB/AGE: 1965-07-25 56 y.o.  Admit date: 05/30/2021 Referring Physician Dr. Ivor Costa Primary Physician Dr. Johny Drilling Primary Cardiologist none Reason for Consultation chest pain  HPI: The patient is a 56 year old male with a past medical history significant for hyperlipidemia, history of TIA 2021, arthritis, history of prostate cancer s/p radiation, prediabetes, current tobacco use who presented to [facility] ED who presented to [facility] ED 05/30/2021 with chest pain.  Interval history: -lexiscan myoview resulted yesterday as low risk  -patient denies further chest pain overnight. No SOB, palpitations.   Review of systems complete and found to be negative unless listed above     Past Medical History:  Diagnosis Date   Prostate cancer [facility])     Past Surgical History:  Procedure Laterality Date   HIP SURGERY Right 2015   JOINT REPLACEMENT Right    hip   PROSTATE BIOPSY     RADIOACTIVE SEED IMPLANT N/A 08/20/2016   Procedure: RADIOACTIVE SEED IMPLANT/BRACHYTHERAPY IMPLANT;  Surgeon: Hollice Espy, MD;  Location: [facility] ORS;  Service: Urology;  Laterality: N/A;   TONSILLECTOMY      Medications Prior to Admission  Medication Sig Dispense Refill Last Dose   acetaminophen (TYLENOL) 500 MG tablet Take 1,000 mg by mouth as needed.   05/30/2021 at 0500   aspirin EC 81 MG EC tablet Take 1 tablet (81 mg total) by mouth daily.   05/30/2021 at 0500   Hyprom-Naphaz-Polysorb-Zn Sulf (CLEAR EYES COMPLETE OP) Place 1 drop into both eyes 3 (three) times daily as needed (dry eyes).   05/30/2021 at 0500   atorvastatin (LIPITOR) 80 MG tablet Take 1 tablet (80 mg total) by mouth daily at 6 PM. (Patient not taking: Reported on 05/30/2021) 30 tablet 3 Not Taking   clopidogrel (PLAVIX) 75 MG tablet Take 75 mg by mouth daily. (Patient not taking: Reported on 05/30/2021)   Not Taking   sildenafil (VIAGRA) 25 MG tablet Take 1  tablet (25 mg total) by mouth daily as needed for erectile dysfunction. 15 tablet 4 05/27/2021   tamsulosin (FLOMAX) 0.4 MG CAPS capsule TAKE 1 CAPSULE (0.4 MG TOTAL) BY MOUTH DAILY AFTER SUPPER. (Patient not taking: Reported on 05/30/2021) 30 capsule 11 Not Taking    Social History   Socioeconomic History   Marital status: Married    Spouse name: Not on file   Number of children: Not on file   Years of education: Not on file   Highest education level: Not on file  Occupational History   Not on file  Tobacco Use   Smoking status: Every Day    Packs/day: 0.25    Types: Cigarettes   Smokeless tobacco: Never  Substance and Sexual Activity   Alcohol use: Yes    Comment: beer occassional   Drug use: Yes    Types: Marijuana   Sexual activity: Yes  Other Topics Concern   Not on file  Social History Narrative   Not on file   Social Determinants of Health   Financial Resource Strain: Not on file  Food Insecurity: Not on file  Transportation Needs: Not on file  Physical Activity: Not on file  Stress: Not on file  Social Connections: Not on file  Intimate Partner Violence: Not on file    Family History  Problem Relation Age of Onset   Prostate cancer Neg Hx    Bladder Cancer Neg Hx    Kidney  cancer Neg Hx       Review of systems complete and found to be negative unless listed above    PHYSICAL EXAM General: Pleasant black male, well nourished, in no acute distress.  Sitting upright in PCU bed in street clothes. HEENT:  Normocephalic and atraumatic. Neck:  No JVD.  Lungs: Normal respiratory effort on room air. Clear bilaterally to auscultation. No wheezes, crackles, rhonchi.  Heart: HRRR . Normal S1 and S2 without gallops or murmurs. Radial & DP pulses 2+ bilaterally. Abdomen: Obese appearing.  Msk: Normal strength and tone for age. Extremities: Warm and well perfused. No clubbing, cyanosis.  No lower extremity edema.  Neuro: Alert and oriented X 3. Psych:  Answers  questions appropriately.   Labs:   Lab Results  Component Value Date   WBC 5.6 05/30/2021   HGB 14.7 05/30/2021   HCT 43.8 05/30/2021   MCV 93.6 05/30/2021   PLT 204 05/30/2021    Recent Labs  Lab 05/30/21 0536  NA 139  K 4.2  CL 108  CO2 23  BUN 14  CREATININE 0.89  CALCIUM 9.1  GLUCOSE 112*    Lab Results  Component Value Date   CKTOTAL 1,048 (H) 05/01/2019     Lab Results  Component Value Date   CHOL 212 (H) 05/31/2021   CHOL 200 05/02/2019   CHOL 228 (H) 01/08/2017   Lab Results  Component Value Date   HDL 54 05/31/2021   HDL 56 05/02/2019   HDL 45 01/08/2017   Lab Results  Component Value Date   LDLCALC 138 (H) 05/31/2021   LDLCALC 127 (H) 05/02/2019   LDLCALC 157 (H) 01/08/2017   Lab Results  Component Value Date   TRIG 101 05/31/2021   TRIG 87 05/02/2019   TRIG 130 01/08/2017   Lab Results  Component Value Date   CHOLHDL 3.9 05/31/2021   CHOLHDL 3.6 05/02/2019   CHOLHDL 5.1 01/08/2017   No results found for: LDLDIRECT    Radiology: DG Chest Portable 1 View  Result Date: 05/30/2021 CLINICAL DATA:  Chest pain. EXAM: PORTABLE CHEST 1 VIEW COMPARISON:  None. FINDINGS: The heart size and mediastinal contours are within normal limits. Aortic atherosclerotic calcifications noted. Both lungs are clear. The visualized skeletal structures are unremarkable. IMPRESSION: No active disease. Electronically Signed   By: Kerby Moors M.D.   On: 05/30/2021 06:17   CT Angio Chest/Abd/Pel for Dissection W and/or Wo Contrast  Result Date: 05/30/2021 CLINICAL DATA:  56 year old male with chest and back pain during physical activity this morning. EXAM: CT ANGIOGRAPHY CHEST, ABDOMEN AND PELVIS TECHNIQUE: Non-contrast CT of the chest was initially obtained. Multidetector CT imaging through the chest, abdomen and pelvis was performed using the standard protocol during bolus administration of intravenous contrast. Multiplanar reconstructed images and MIPs were  obtained and reviewed to evaluate the vascular anatomy. RADIATION DOSE REDUCTION: This exam was performed according to the departmental dose-optimization program which includes automated exposure control, adjustment of the mA and/or kV according to patient size and/or use of iterative reconstruction technique. CONTRAST:  129mL OMNIPAQUE IOHEXOL 350 MG/ML SOLN COMPARISON:  Portable chest 0559 hours. FINDINGS: CTA CHEST FINDINGS Cardiovascular: Calcified coronary artery atherosclerosis. Calcified aortic atherosclerosis. Negative for thoracic aortic dissection or aneurysm. Three vessel arch configuration, proximal great vessels appear patent. Central pulmonary arteries appear patent. No cardiomegaly or pericardial effusion. Mediastinum/Nodes: Negative. No mediastinal mass or lymphadenopathy. Lungs/Pleura: Major airways are patent. Lung volumes appear normal. Tiny 3 mm left lung subpleural nodule on series  6, image 82. Minor bilateral linear lower lobe scarring or atelectasis. Small right middle lobe 4 mm lung nodule best seen on series 7, image 67. No pleural effusion or evidence of acute lung inflammation. Musculoskeletal: Severe upper thoracic disc and endplate degeneration with degenerative endplate sclerosis T3 through T5. No acute osseous abnormality identified. Review of the MIP images confirms the above findings. CTA ABDOMEN AND PELVIS FINDINGS VASCULAR Aortoiliac calcified atherosclerosis. With combined soft and calcified atherosclerosis throughout. Negative for abdominal aortic aneurysm or dissection. Major aortic branches remain patent. Bilateral iliac and proximal femoral arteries remain patent despite atherosclerosis. Review of the MIP images confirms the above findings. NON-VASCULAR Hepatobiliary: Negative liver and gallbladder. Pancreas: Negative. Spleen: Negative. Adrenals/Urinary Tract: Normal adrenal glands. 2-3 mm left renal midpole calculus. But no hydronephrosis. Symmetric renal enhancement. No  pararenal inflammation. No hydroureter. Diminutive bladder. Stomach/Bowel: Decompressed large bowel from the splenic flexure to the rectum. Mild upstream large bowel retained stool. Normal appendix on series 5, image 157. No large bowel inflammation. No dilated small bowel. Mostly decompressed stomach and duodenum. No free air, free fluid, mesenteric inflammation. Lymphatic: No lymphadenopathy identified. Reproductive: Sequelae of prostate brachytherapy. Other: No pelvic free fluid. Musculoskeletal: Chronic L5 pars fractures but but no associated spondylolisthesis. Chronic severe lumbar facet arthropathy at L3-L4 with vacuum facet. Previous right hip arthroplasty. Extensive subchondral left acetabular sclerosis combined with subchondral cysts. No acute osseous abnormality identified. Review of the MIP images confirms the above findings. IMPRESSION: 1. Negative for aortic aneurysm or dissection. Positive for Aortic Atherosclerosis (ICD10-I70.0), coronary artery atherosclerosis. 2. No acute or inflammatory process identified in the chest, abdomen, or pelvis. Left nephrolithiasis. Thoracic spine degeneration. Chronic L5 pars fractures and severe L3-L4 facet arthropathy. Hip degeneration, previous right hip arthroplasty. 3. Several small pulmonary nodules. Most severe: 4 mm right solid pulmonary nodule. No routine follow-up imaging is recommended per Fleischner Society Guidelines. These guidelines do not apply to immunocompromised patients and patients with cancer. Follow up in patients with significant comorbidities as clinically warranted. For lung cancer screening, adhere to Lung-RADS guidelines. Reference: Radiology. 2017; 284(1):228-43. Electronically Signed   By: Genevie Ann M.D.   On: 05/30/2021 07:45    ECHO 05/01/2019  1. Left ventricular ejection fraction, by visual estimation, is 60 to  65%. The left ventricle has normal function. There is no left ventricular  hypertrophy. Normal diastolic function.   2.  Global right ventricle has normal systolic function.The right  ventricular size is normal.   3. Left atrial size was normal.   4. Right atrial size was normal.   5. The mitral valve is normal in structure. No evidence of mitral valve  regurgitation.   6. The tricuspid valve is normal in structure.   7. The aortic valve is tricuspid. Aortic valve regurgitation is not  visualized. No evidence of aortic valve sclerosis or stenosis.   8. The pulmonic valve was not well visualized. Pulmonic valve  regurgitation is not visualized.   9. TR signal is inadequate for assessing pulmonary artery systolic  pressure.  10. The inferior vena cava is normal in size with <50% respiratory  variability, suggesting right atrial pressure of 8 mmHg.   TELEMETRY reviewed by me: Sinus rhythm rate 90  EKG reviewed by me: Sinus rhythm rate 93 ?Old inferior infarct, similar in appearance to EKG from 2021.  ASSESSMENT AND PLAN:  The patient is a 56 year old male with a past medical history significant for hyperlipidemia, history of TIA 2021, arthritis, history of prostate cancer s/p  radiation, prediabetes, current tobacco use who presented to [facility] ED who presented to [facility] ED 05/30/2021 with chest pain.  #Unstable angina Patient reports a number of months of "windedness with exertion" and occasional associated central chest pain.  3 days ago while he was having intercourse with his wife he had central chest pain that radiated to his left arm/had arm numbness which terminated with rest.  This pain has been occurring with exertion more frequently over the past 3 days which prompted his presentation to the ED.  EKG is without acute ischemic changes, troponins are flat 10-9.  He certainly has risk factors for coronary artery disease like hyperlipidemia, history of TIA and ongoing tobacco abuse therefore, further cardiac work-up was warranted. Lexiscan myoview 2/14 resulted low risk without ischemia.  -s/p 324 mg of aspirin.   Continue 81 mg aspirin daily -continue atorvastatin 80 mg. -continue metoprolol succinate 25 mg. -stress test normal without ischemia. OK for discharge from a cardiac standpoint today. He will need close follow up with Dr. Nehemiah Massed outpatient in 1-2 weeks.   #History of TIA #Hyperlipidemia #Elevated blood pressure -denies prior history of hypertension, but his BP has been persistently above goal while in the 128N - 867E systolic. Patient reports she has not been taking atorvastatin for some time because "he was feeling good." -Recommend restarting high intensity statin therapy -continue metoprolol.  -educated pt to start checking his blood pressure at home.   #Tobacco abuse Patient smokes half pack of cigarettes per day x10 years.  He said he has quit once before in the past and is very motivated to quit now.  Strongly encouraged smoking cessation.  This patient's plan of care was discussed and created with Dr. Nehemiah Massed and he is in agreement.  Signed: Tristan Schroeder , PA-C 05/31/2021, 9:26 AM [facility]

## 2021-05-31 NOTE — Progress Notes (Signed)
Patient provided with discharge education and materials, verbalized understanding. IV access and telemetry monitor removed, no issues noted. Patient discharged from unit with all belongings.

## 2021-05-31 NOTE — Progress Notes (Signed)
°  Transition of Care [facility]) Screening Note   Patient Details  Name: HOLBERT CAPLES Date of Birth: 1965/09/12   Transition of Care [facility]) CM/SW Contact:    Alberteen Sam, LCSW Phone Number: 05/31/2021, 9:48 AM    Transition of Care Department [facility]) has reviewed patient and no TOC needs have been identified at this time. We will continue to monitor patient advancement through interdisciplinary progression rounds. If new patient transition needs arise, please place a TOC consult.  John Day, [place]

## 2021-06-01 NOTE — Discharge Summary (Signed)
Physician Discharge Summary   Patient: Bradley Shaw MRN: [id number] DOB: 05-31-1965  Admit date:     05/30/2021  Discharge date: 05/31/2021  Discharge Physician: Max Sane   PCP: Valera Castle, MD   Recommendations at discharge:   With outpatient providers as requested  Discharge Diagnoses: Principal Problem:   Chest pain Active Problems:   Prostate Cancer  (Large Volume Moderate Risk)   Stroke ([place])   Obesity with body mass index (BMI) of 30.0 to 39.9   Tobacco use   HLD (hyperlipidemia)   Lung nodule   Hospital Course: 56 year old male with a known history of hyperlipidemia, TIA, DJD, history of prostate cancer s/p radiation, prediabetes, tobacco abuse admitted for unstable angina  Unstable angina Negative serial troponins.  EKG without any acute ischemic changes Lexiscan normal without ischemia Outpatient cardiology follow-up   Hypertension Controlled  Prostate cancer S/p radiation seeds  History of CVA On aspirin and statin  Morbid obesity Counseled for weight loss  Tobacco abuse Counseled for smoking cessation  Hyperlipidemia On statin     Consultants: Cardiology Disposition: Home Diet recommendation:  Discharge Diet Orders (From admission, onward)     Start     Ordered   05/31/21 0000  Diet - low sodium heart healthy        05/31/21 0945           Cardiac diet  DISCHARGE MEDICATION: Allergies as of 05/31/2021       Reactions   Shellfish Allergy Anaphylaxis   Hives & throat closes        Medication List     STOP taking these medications    clopidogrel 75 MG tablet Commonly known as: PLAVIX   tamsulosin 0.4 MG Caps capsule Commonly known as: FLOMAX       TAKE these medications    acetaminophen 500 MG tablet Commonly known as: TYLENOL Take 1,000 mg by mouth as needed.   aspirin 81 MG EC tablet Take 1 tablet (81 mg total) by mouth daily.   atorvastatin 20 MG tablet Commonly known as: Lipitor Take 1  tablet (20 mg total) by mouth daily. What changed:  medication strength how much to take when to take this   [place] 1 drop into both eyes 3 (three) times daily as needed (dry eyes).   sildenafil 25 MG tablet Commonly known as: VIAGRA Take 1 tablet (25 mg total) by mouth daily as needed for erectile dysfunction.        Follow-up Information     Corey Skains, MD. Go on 06/30/2021.   Specialty: Cardiology Why: 1-2 weeks after discharge @ 9:45am Contact information: [address] [facility] [place] [address] [phone]         Valera Castle, MD. Schedule an appointment as soon as possible for a visit on 06/06/2021.   Specialty: Family Medicine Why: [facility] Discharge F/UP @ 9am Contact information: [place] [address] [phone]                 Discharge Exam: Danley Danker Weights   05/30/21 0531  Weight: 115.4 kg   General: Not in acute distress HEENT:       Eyes: PERRL, EOMI, no scleral icterus.       ENT: No discharge from the ears and nose, no pharynx injection, no tonsillar enlargement.        Neck: No JVD, no bruit, no mass felt. Heme: No neck lymph  node enlargement. Cardiac: S1/S2, RRR, No murmurs, No gallops or rubs. Respiratory: No rales, wheezing, rhonchi or rubs. GI: Soft, nondistended, nontender, no rebound pain, no organomegaly, BS present. GU: No hematuria Ext: No pitting leg edema bilaterally. 1+DP/PT pulse bilaterally. Musculoskeletal: No joint deformities, No joint redness or warmth, no limitation of ROM in spin. Skin: No rashes.  Neuro: Alert, oriented X3, cranial nerves II-XII grossly intact, moves all extremities normally.  Psych: Patient is not psychotic, no suicidal or hemocidal ideation.  Condition at discharge: good  The results of significant diagnostics from this hospitalization (including imaging, microbiology, ancillary and laboratory)  are listed below for reference.   Imaging Studies: NM Myocar Multi W/Spect W/Wall Motion / EF  Result Date: 05/31/2021   The study is normal. The study is low risk.   No ST deviation was noted.   LV perfusion is normal.   Left ventricular function is normal. End diastolic cavity size is normal.   Prior study not available for comparison.   DG Chest Portable 1 View  Result Date: 05/30/2021 CLINICAL DATA:  Chest pain. EXAM: PORTABLE CHEST 1 VIEW COMPARISON:  None. FINDINGS: The heart size and mediastinal contours are within normal limits. Aortic atherosclerotic calcifications noted. Both lungs are clear. The visualized skeletal structures are unremarkable. IMPRESSION: No active disease. Electronically Signed   By: Kerby Moors M.D.   On: 05/30/2021 06:17   CT Angio Chest/Abd/Pel for Dissection W and/or Wo Contrast  Result Date: 05/30/2021 CLINICAL DATA:  56 year old male with chest and back pain during physical activity this morning. EXAM: CT ANGIOGRAPHY CHEST, ABDOMEN AND PELVIS TECHNIQUE: Non-contrast CT of the chest was initially obtained. Multidetector CT imaging through the chest, abdomen and pelvis was performed using the standard protocol during bolus administration of intravenous contrast. Multiplanar reconstructed images and MIPs were obtained and reviewed to evaluate the vascular anatomy. RADIATION DOSE REDUCTION: This exam was performed according to the departmental dose-optimization program which includes automated exposure control, adjustment of the mA and/or kV according to patient size and/or use of iterative reconstruction technique. CONTRAST:  1110mL OMNIPAQUE IOHEXOL 350 MG/ML SOLN COMPARISON:  Portable chest 0559 hours. FINDINGS: CTA CHEST FINDINGS Cardiovascular: Calcified coronary artery atherosclerosis. Calcified aortic atherosclerosis. Negative for thoracic aortic dissection or aneurysm. Three vessel arch configuration, proximal great vessels appear patent. Central pulmonary  arteries appear patent. No cardiomegaly or pericardial effusion. Mediastinum/Nodes: Negative. No mediastinal mass or lymphadenopathy. Lungs/Pleura: Major airways are patent. Lung volumes appear normal. Tiny 3 mm left lung subpleural nodule on series 6, image 82. Minor bilateral linear lower lobe scarring or atelectasis. Small right middle lobe 4 mm lung nodule best seen on series 7, image 67. No pleural effusion or evidence of acute lung inflammation. Musculoskeletal: Severe upper thoracic disc and endplate degeneration with degenerative endplate sclerosis T3 through T5. No acute osseous abnormality identified. Review of the MIP images confirms the above findings. CTA ABDOMEN AND PELVIS FINDINGS VASCULAR Aortoiliac calcified atherosclerosis. With combined soft and calcified atherosclerosis throughout. Negative for abdominal aortic aneurysm or dissection. Major aortic branches remain patent. Bilateral iliac and proximal femoral arteries remain patent despite atherosclerosis. Review of the MIP images confirms the above findings. NON-VASCULAR Hepatobiliary: Negative liver and gallbladder. Pancreas: Negative. Spleen: Negative. Adrenals/Urinary Tract: Normal adrenal glands. 2-3 mm left renal midpole calculus. But no hydronephrosis. Symmetric renal enhancement. No pararenal inflammation. No hydroureter. Diminutive bladder. Stomach/Bowel: Decompressed large bowel from the splenic flexure to the rectum. Mild upstream large bowel retained stool. Normal appendix on series 5, image 157.  No large bowel inflammation. No dilated small bowel. Mostly decompressed stomach and duodenum. No free air, free fluid, mesenteric inflammation. Lymphatic: No lymphadenopathy identified. Reproductive: Sequelae of prostate brachytherapy. Other: No pelvic free fluid. Musculoskeletal: Chronic L5 pars fractures but but no associated spondylolisthesis. Chronic severe lumbar facet arthropathy at L3-L4 with vacuum facet. Previous right hip  arthroplasty. Extensive subchondral left acetabular sclerosis combined with subchondral cysts. No acute osseous abnormality identified. Review of the MIP images confirms the above findings. IMPRESSION: 1. Negative for aortic aneurysm or dissection. Positive for Aortic Atherosclerosis (ICD10-I70.0), coronary artery atherosclerosis. 2. No acute or inflammatory process identified in the chest, abdomen, or pelvis. Left nephrolithiasis. Thoracic spine degeneration. Chronic L5 pars fractures and severe L3-L4 facet arthropathy. Hip degeneration, previous right hip arthroplasty. 3. Several small pulmonary nodules. Most severe: 4 mm right solid pulmonary nodule. No routine follow-up imaging is recommended per Fleischner Society Guidelines. These guidelines do not apply to immunocompromised patients and patients with cancer. Follow up in patients with significant comorbidities as clinically warranted. For lung cancer screening, adhere to Lung-RADS guidelines. Reference: Radiology. 2017; 284(1):228-43. Electronically Signed   By: Genevie Ann M.D.   On: 05/30/2021 07:45    Microbiology: Results for orders placed or performed during the hospital encounter of 05/30/21  Resp Panel by RT-PCR (Flu A&B, Covid) Nasopharyngeal Swab     Status: None   Collection Time: 05/30/21  9:12 AM   Specimen: Nasopharyngeal Swab; Nasopharyngeal(NP) swabs in vial transport medium  Result Value Ref Range Status   SARS Coronavirus 2 by RT PCR NEGATIVE NEGATIVE Final    Comment: (NOTE) SARS-CoV-2 target nucleic acids are NOT DETECTED.  The SARS-CoV-2 RNA is generally detectable in upper respiratory specimens during the acute phase of infection. The lowest concentration of SARS-CoV-2 viral copies this assay can detect is 138 copies/mL. A negative result does not preclude SARS-Cov-2 infection and should not be used as the sole basis for treatment or other patient management decisions. A negative result may occur with  improper specimen  collection/handling, submission of specimen other than nasopharyngeal swab, presence of viral mutation(s) within the areas targeted by this assay, and inadequate number of viral copies(<138 copies/mL). A negative result must be combined with clinical observations, patient history, and epidemiological information. The expected result is Negative.  Fact Sheet for Patients:  EntrepreneurPulse.com.au  Fact Sheet for Healthcare Providers:  IncredibleEmployment.be  This test is no t yet approved or cleared by the [place] FDA and  has been authorized for detection and/or diagnosis of SARS-CoV-2 by FDA under an Emergency Use Authorization (EUA). This EUA will remain  in effect (meaning this test can be used) for the duration of the COVID-19 declaration under Section 564(b)(1) of the Act, 21 U.S.C.section 360bbb-3(b)(1), unless the authorization is terminated  or revoked sooner.       Influenza A by PCR NEGATIVE NEGATIVE Final   Influenza B by PCR NEGATIVE NEGATIVE Final    Comment: (NOTE) The Xpert Xpress SARS-CoV-2/FLU/RSV plus assay is intended as an aid in the diagnosis of influenza from Nasopharyngeal swab specimens and should not be used as a sole basis for treatment. Nasal washings and aspirates are unacceptable for Xpert Xpress SARS-CoV-2/FLU/RSV testing.  Fact Sheet for Patients: EntrepreneurPulse.com.au  Fact Sheet for Healthcare Providers: IncredibleEmployment.be  This test is not yet approved or cleared by the [place] FDA and has been authorized for detection and/or diagnosis of SARS-CoV-2 by FDA under an Emergency Use Authorization (EUA). This EUA will remain in effect (meaning  this test can be used) for the duration of the COVID-19 declaration under Section 564(b)(1) of the Act, 21 U.S.C. section 360bbb-3(b)(1), unless the authorization is terminated or revoked.  Performed at [facility], [address] [place], [place] [address]     Labs: CBC: Recent Labs  Lab 05/30/21 0536  WBC 5.6  HGB 14.7  HCT 43.8  MCV 93.6  PLT 286   Basic Metabolic Panel: Recent Labs  Lab 05/30/21 0536  NA 139  K 4.2  CL 108  CO2 23  GLUCOSE 112*  BUN 14  CREATININE 0.89  CALCIUM 9.1   Liver Function Tests: No results for input(s): AST, ALT, ALKPHOS, BILITOT, PROT, ALBUMIN in the last 168 hours. CBG: No results for input(s): GLUCAP in the last 168 hours.  Discharge time spent: greater than 30 minutes.  Signed: Max Sane, MD [facility] 06/01/2021

## 2021-07-25 ENCOUNTER — Other Ambulatory Visit: Payer: Self-pay | Admitting: *Deleted

## 2021-07-25 MED ORDER — TAMSULOSIN HCL 0.4 MG PO CAPS: 0.4000 mg | ORAL_CAPSULE | Freq: Every day | ORAL | 11 refills | Status: DC

## 2021-08-18 ENCOUNTER — Inpatient Hospital Stay: Payer: Managed Care, Other (non HMO) | Attending: Radiation Oncology

## 2021-08-25 ENCOUNTER — Ambulatory Visit: Payer: Managed Care, Other (non HMO) | Admitting: Radiation Oncology

## 2021-09-01 ENCOUNTER — Ambulatory Visit: Payer: Managed Care, Other (non HMO) | Admitting: Radiation Oncology

## 2021-09-20 ENCOUNTER — Encounter: Payer: Self-pay | Admitting: Radiation Oncology

## 2021-09-20 ENCOUNTER — Inpatient Hospital Stay: Payer: Managed Care, Other (non HMO) | Attending: Radiation Oncology

## 2021-09-20 ENCOUNTER — Ambulatory Visit
Admission: RE | Admit: 2021-09-20 | Discharge: 2021-09-20 | Disposition: A | Discharge status: Home / Self Care | Payer: Managed Care, Other (non HMO) | Source: Ambulatory Visit | Attending: Radiation Oncology | Admitting: Radiation Oncology

## 2021-09-20 VITALS — BP 148/86 | HR 90 | Temp 98.1°F | Resp 20 | Ht 63.0 in | Wt 272.0 lb

## 2021-09-20 DIAGNOSIS — Z8546 Personal history of malignant neoplasm of prostate: Secondary | ICD-10-CM | POA: Diagnosis present

## 2021-09-20 DIAGNOSIS — C61 Malignant neoplasm of prostate: Secondary | ICD-10-CM | POA: Insufficient documentation

## 2021-09-20 DIAGNOSIS — Z923 Personal history of irradiation: Secondary | ICD-10-CM | POA: Insufficient documentation

## 2021-09-20 LAB — PSA: Prostatic Specific Antigen: 0.01 ng/mL (ref 0.00–4.00)

## 2021-09-20 NOTE — Progress Notes (Signed)
Radiation Oncology Follow up Note  Name: Bradley Shaw   Date:   09/20/2021 MRN:  [id number] DOB: 1965-09-29    This 56 y.o. male presents to the clinic today for 5-year follow-up status post ADT therapy plus external beam radiation therapy to prostate and pelvic nodes plus I-125 interstitial implant for Gleason 7 (3+4) presenting with a PSA of 13.6.  REFERRING PROVIDER: Valera Castle, *  HPI: Patient is a 56 year old male now out 5 years having completed both external beam radiation therapy as well as I-125 interstitial implant for a Gleason 7 adenocarcinoma the prostate presenting with a PSA of 13.6.  He was having some pelvic pain although started on Flomax again and that has subsided he is not having any significant lower urinary tract symptoms or diarrhea at this time.  His PSA has not been tested since a year ago when it was 0.02 and he is having his PSA retested today.  COMPLICATIONS OF TREATMENT: none  FOLLOW UP COMPLIANCE: keeps appointments   PHYSICAL EXAM:  BP (!) 148/86 (BP Location: Right Arm, Patient Position: Sitting, Cuff Size: Large)   Pulse 90   Temp 98.1 F (36.7 C) (Tympanic)   Resp 20   Ht '5\' 3"'$  (1.6 m) Comment: stated HT  Wt 272 lb (123.4 kg)   BMI 48.18 kg/m  Well-developed well-nourished patient in NAD. HEENT reveals PERLA, EOMI, discs not visualized.  Oral cavity is clear. No oral mucosal lesions are identified. Neck is clear without evidence of cervical or supraclavicular adenopathy. Lungs are clear to A&P. Cardiac examination is essentially unremarkable with regular rate and rhythm without murmur rub or thrill. Abdomen is benign with no organomegaly or masses noted. Motor sensory and DTR levels are equal and symmetric in the upper and lower extremities. Cranial nerves II through XII are grossly intact. Proprioception is intact. No peripheral adenopathy or edema is identified. No motor or sensory levels are noted. Crude visual fields are within normal  range.  RADIOLOGY RESULTS: No current films for review  PLAN: Present time patient is doing well.  We will report his PSA when it becomes available.  Should it be this continued to be this low we will turn follow-up care over to his PMA for yearly PSA surveillance.  Patient comprehends my recommendations well.  He knows he can contact contact me at any time for assistance.  I would like to take this opportunity to thank you for allowing me to participate in the care of your patient.Noreene Filbert, MD

## 2022-11-01 ENCOUNTER — Other Ambulatory Visit: Payer: Self-pay | Admitting: *Deleted

## 2022-11-01 ENCOUNTER — Telehealth: Payer: Self-pay | Admitting: *Deleted

## 2022-11-01 MED ORDER — TAMSULOSIN HCL 0.4 MG PO CAPS: 0.4000 mg | ORAL_CAPSULE | Freq: Every day | ORAL | 4 refills | Status: AC

## 2022-11-01 NOTE — Telephone Encounter (Signed)
Patient requesting refill of his Flomax. He was last seen 09/20/21 and has no further appts with [place]. Will you refill it or does he need to ask someone else to refill it PLAN: Present time patient is doing well.  We will report his PSA when it becomes available.  Should it be this continued to be this low we will turn follow-up care over to his PMA for yearly PSA surveillance.  Patient comprehends my recommendations well.  He knows he can contact contact me at any time for assistance.  I would like to take this opportunity to thank you for allowing me to participate in the care of your patient.Carmina Miller, MD          Electronically signed by Carmina Miller, MD at 09/20/2021  3:05 PM

## 2023-01-03 ENCOUNTER — Encounter: Payer: Self-pay | Admitting: Emergency Medicine

## 2023-01-03 ENCOUNTER — Other Ambulatory Visit: Payer: Self-pay

## 2023-01-03 ENCOUNTER — Emergency Department
Admission: EM | Admit: 2023-01-03 | Discharge: 2023-01-03 | Disposition: A | Discharge status: Home / Self Care | Payer: Managed Care, Other (non HMO) | Attending: Emergency Medicine | Admitting: Emergency Medicine

## 2023-01-03 ENCOUNTER — Emergency Department: Payer: Managed Care, Other (non HMO)

## 2023-01-03 DIAGNOSIS — X501XXA Overexertion from prolonged static or awkward postures, initial encounter: Secondary | ICD-10-CM | POA: Diagnosis not present

## 2023-01-03 DIAGNOSIS — S4992XA Unspecified injury of left shoulder and upper arm, initial encounter: Secondary | ICD-10-CM | POA: Insufficient documentation

## 2023-01-03 DIAGNOSIS — Y990 Civilian activity done for income or pay: Secondary | ICD-10-CM | POA: Insufficient documentation

## 2023-01-03 NOTE — ED Triage Notes (Signed)
While at work, off loading a truck.  Pulling the dock plate, dock plate snapped back and pulled patient with it and patient felt left shoulder pop.  Arrives c/o pain to left shoulder and decreased ROM.  + radial pulse. + CMS.  Brisk capillary refill .

## 2023-01-03 NOTE — ED Provider Notes (Signed)
[facility] Provider Note    Event Date/Time   First MD Initiated Contact with Patient 01/03/23 1653     (approximate)   History   Shoulder Injury   HPI Bradley Shaw is a 57 y.o. male presenting today for left shoulder injury.  Patient was at work pulling on a dock plate when it released and he felt a pop in his left shoulder.  Noticed pain at the site and decreased range of motion.  Otherwise sensation intact throughout.  Denies direct trauma to the area.  No prior shoulder issues.     Physical Exam   Triage Vital Signs: ED Triage Vitals  Encounter Vitals Group     BP 01/03/23 1610 (!) 136/91     Systolic BP Percentile --      Diastolic BP Percentile --      Pulse Rate 01/03/23 1608 (!) 104     Resp 01/03/23 1608 16     Temp 01/03/23 1608 97.8 F (36.6 C)     Temp Source 01/03/23 1608 Oral     SpO2 01/03/23 1608 94 %     Weight 01/03/23 1609 272 lb 0.8 oz (123.4 kg)     Height 01/03/23 1646 5\' 3"  (1.6 m)     Head Circumference --      Peak Flow --      Pain Score 01/03/23 1609 7     Pain Loc --      Pain Education --      Exclude from Growth Chart --     Most recent vital signs: Vitals:   01/03/23 1608 01/03/23 1610  BP:  (!) 136/91  Pulse: (!) 104   Resp: 16   Temp: 97.8 F (36.6 C)   SpO2: 94%    I have reviewed the vital signs. General:  Awake, alert, no acute distress. Head:  Normocephalic, Atraumatic. EENT:  PERRL, EOMI, Oral mucosa pink and moist, Neck is supple. Cardiovascular: Regular rate, 2+ distal pulses. Respiratory:  Normal respiratory effort, symmetrical expansion, no distress.   Extremities: Mild pain with passive range of motion of the left shoulder.  Sensation intact throughout left upper extremity.  Positive Neer's test.  Pain most associated with abduction of the left shoulder.  No other tenderness palpation throughout left upper extremity. Neuro:  Alert and oriented.  Interacting appropriately.   Skin:   Warm, dry, no rash.   Psych: Appropriate affect.     ED Results / Procedures / Treatments   Labs (all labs ordered are listed, but only abnormal results are displayed) Labs Reviewed - No data to display   EKG    RADIOLOGY Independently interpreted with no acute pathology   PROCEDURES:  Critical Care performed: No  Procedures   MEDICATIONS ORDERED IN ED: Medications - No data to display   IMPRESSION / MDM / ASSESSMENT AND PLAN / ED COURSE  I reviewed the triage vital signs and the nursing notes.                              Differential diagnosis includes, but is not limited to, left shoulder dislocation, left humerus fracture, rotator cuff injury, supraspinatus tear.  Patient's presentation is most consistent with acute complicated illness / injury requiring diagnostic workup.  Patient is a 57 year old male presenting today for left shoulder injury.  X-ray reveals no evidence of fracture or dislocation.  Range of motion somewhat limited secondary to  pain.  Otherwise sensation and vascular structures intact.  Suspect a likely potential rotator cuff tear or injury at this time.  Patient safe for discharge until to use NSAIDs, ice, and to follow-up with orthopedics for ongoing management.  He was given strict return precautions.  Clinical Course as of 01/03/23 1716  Thu Jan 03, 2023  1653 DG Shoulder Left Independently interpreted with no evidence of fracture or dislocation [DW]    Clinical Course User Index [DW] Janith Lima, MD     FINAL CLINICAL IMPRESSION(S) / ED DIAGNOSES   Final diagnoses:  Injury of left shoulder, initial encounter     Rx / DC Orders   ED Discharge Orders     None        Note:  This document was prepared using Dragon voice recognition software and may include unintentional dictation errors.   Janith Lima, MD 01/03/23 [phone]

## 2023-01-03 NOTE — ED Notes (Signed)
See triage note  Presents with pain to left shoulder   States he was pulling on dock plate  It snapped  pulled his arm  Having pain to shoulder area  Good pulses

## 2023-01-03 NOTE — Discharge Instructions (Signed)
You were seen in the emergency department today for your left shoulder injury.  No evidence of fracture or dislocation.  Concern for possible injury to the rotator cuff of your left shoulder.  Please follow-up with orthopedics in the next couple of days for reassessment.  Please return to the emergency department for any worsening concerns.

## 2023-02-05 ENCOUNTER — Telehealth: Payer: Self-pay

## 2023-02-05 NOTE — Telephone Encounter (Signed)
Transition Care Management Unsuccessful Follow-up Telephone Call  Date of discharge and from where:  [place] 9/19  Attempts:  1st Attempt  Reason for unsuccessful TCM follow-up call:  No answer/busy   Lenard Forth [place]  [facility], [facility] Guide, Phone: [phone] Website: [email or web address]

## 2023-02-06 ENCOUNTER — Telehealth: Payer: Self-pay

## 2023-02-06 NOTE — Telephone Encounter (Signed)
Transition Care Management Unsuccessful Follow-up Telephone Call  Date of discharge and from where:  [place] 9/19  Attempts:  2nd Attempt  Reason for unsuccessful TCM follow-up call:  No answer/busy   Lenard Forth [place]  [facility], [facility] Guide, Phone: [phone] Website: [email or web address]

## 2023-06-09 IMAGING — DX DG CHEST 1V PORT
1 series · 1 of 1 positions shown · non-contrast
Comparison: None.

CLINICAL DATA: Chest pain.

EXAM:
PORTABLE CHEST 1 VIEW

[chest ap]
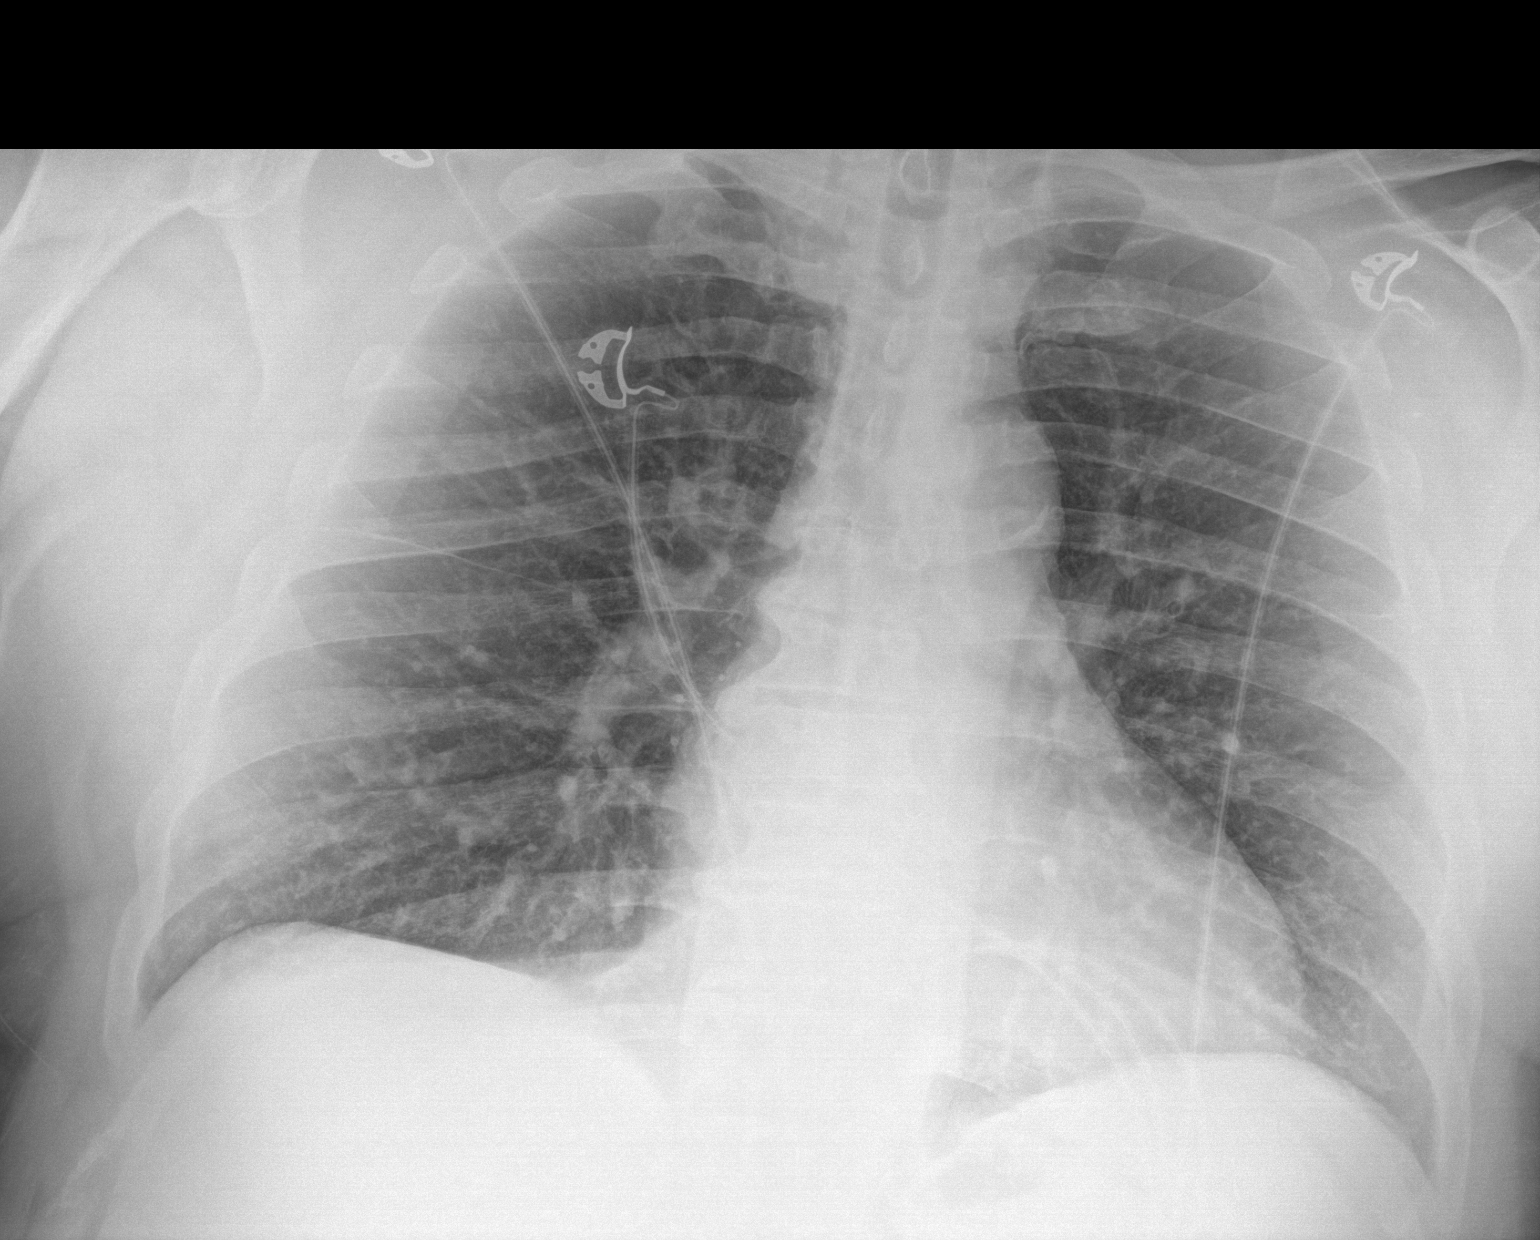

[1 of 1 positions shown; findings below may reference images not displayed]

FINDINGS: The heart size and mediastinal contours are within normal limits.
Aortic atherosclerotic calcifications noted. Both lungs are clear.
The visualized skeletal structures are unremarkable.
IMPRESSION: No active disease.

## 2023-06-28 ENCOUNTER — Other Ambulatory Visit: Payer: Self-pay | Admitting: Orthopedic Surgery

## 2023-07-19 ENCOUNTER — Other Ambulatory Visit: Payer: Self-pay

## 2023-07-19 ENCOUNTER — Encounter
Admission: RE | Admit: 2023-07-19 | Discharge: 2023-07-19 | Disposition: A | Discharge status: Home / Self Care | Source: Ambulatory Visit | Attending: Orthopedic Surgery | Admitting: Orthopedic Surgery

## 2023-07-19 VITALS — BP 145/74 | HR 98 | Resp 18 | Ht 66.0 in | Wt 263.7 lb

## 2023-07-19 DIAGNOSIS — Z01818 Encounter for other preprocedural examination: Secondary | ICD-10-CM | POA: Diagnosis not present

## 2023-07-19 DIAGNOSIS — Z01810 Encounter for preprocedural cardiovascular examination: Secondary | ICD-10-CM | POA: Diagnosis present

## 2023-07-19 DIAGNOSIS — R9431 Abnormal electrocardiogram [ECG] [EKG]: Secondary | ICD-10-CM | POA: Diagnosis not present

## 2023-07-19 DIAGNOSIS — Z01812 Encounter for preprocedural laboratory examination: Secondary | ICD-10-CM | POA: Diagnosis present

## 2023-07-19 HISTORY — DX: Atherosclerotic heart disease of native coronary artery with other forms of angina pectoris: I25.118

## 2023-07-19 HISTORY — DX: Transient cerebral ischemic attack, unspecified: G45.9

## 2023-07-19 HISTORY — DX: Sleep apnea, unspecified: G47.30

## 2023-07-19 HISTORY — DX: Essential (primary) hypertension: I10

## 2023-07-19 HISTORY — DX: Cerebral infarction, unspecified: I63.9

## 2023-07-19 LAB — TYPE AND SCREEN
ABO/RH(D): AB POS
Antibody Screen: NEGATIVE

## 2023-07-19 LAB — CBC WITH DIFFERENTIAL/PLATELET
Abs Immature Granulocytes: 0 10*3/uL (ref 0.00–0.07)
Basophils Absolute: 0.1 10*3/uL (ref 0.0–0.1)
Basophils Relative: 1 %
Eosinophils Absolute: 0 10*3/uL (ref 0.0–0.5)
Eosinophils Relative: 1 %
HCT: 40.1 % (ref 39.0–52.0)
Hemoglobin: 13.8 g/dL (ref 13.0–17.0)
Immature Granulocytes: 0 %
Lymphocytes Relative: 32 %
Lymphs Abs: 1.5 10*3/uL (ref 0.7–4.0)
MCH: 30.7 pg (ref 26.0–34.0)
MCHC: 34.4 g/dL (ref 30.0–36.0)
MCV: 89.3 fL (ref 80.0–100.0)
Monocytes Absolute: 0.4 10*3/uL (ref 0.1–1.0)
Monocytes Relative: 9 %
Neutro Abs: 2.6 10*3/uL (ref 1.7–7.7)
Neutrophils Relative %: 57 %
Platelets: 217 10*3/uL (ref 150–400)
RBC: 4.49 MIL/uL (ref 4.22–5.81)
RDW: 13.8 % (ref 11.5–15.5)
WBC: 4.5 10*3/uL (ref 4.0–10.5)
nRBC: 0 % (ref 0.0–0.2)

## 2023-07-19 LAB — URINALYSIS, ROUTINE W REFLEX MICROSCOPIC
Bilirubin Urine: NEGATIVE
Glucose, UA: NEGATIVE mg/dL
Hgb urine dipstick: NEGATIVE
Ketones, ur: NEGATIVE mg/dL
Leukocytes,Ua: NEGATIVE
Nitrite: NEGATIVE
Protein, ur: NEGATIVE mg/dL
Specific Gravity, Urine: 1.027 (ref 1.005–1.030)
pH: 5 (ref 5.0–8.0)

## 2023-07-19 LAB — COMPREHENSIVE METABOLIC PANEL WITH GFR
ALT: 29 U/L (ref 0–44)
AST: 25 U/L (ref 15–41)
Albumin: 4.3 g/dL (ref 3.5–5.0)
Alkaline Phosphatase: 78 U/L (ref 38–126)
Anion gap: 13 (ref 5–15)
BUN: 18 mg/dL (ref 6–20)
CO2: 21 mmol/L — ABNORMAL LOW (ref 22–32)
Calcium: 9.6 mg/dL (ref 8.9–10.3)
Chloride: 104 mmol/L (ref 98–111)
Creatinine, Ser: 0.83 mg/dL (ref 0.61–1.24)
GFR, Estimated: >60 mL/min
Glucose, Bld: 109 mg/dL — ABNORMAL HIGH (ref 70–99)
Potassium: 3.7 mmol/L (ref 3.5–5.1)
Sodium: 138 mmol/L (ref 135–145)
Total Bilirubin: 0.8 mg/dL (ref 0.0–1.2)
Total Protein: 7.7 g/dL (ref 6.5–8.1)

## 2023-07-19 LAB — SURGICAL PCR SCREEN
MRSA, PCR: NEGATIVE
Staphylococcus aureus: NEGATIVE

## 2023-07-19 NOTE — Patient Instructions (Addendum)
 Your procedure is scheduled on: 08/01/23 - Thursday Report to the Registration Desk on the 1st floor of the [facility]. To find out your arrival time, please call [phone] between 1PM - 3PM on: 07/31/23 - Wednesday If your arrival time is 6:00 am, do not arrive before that time as the [facility] entrance doors do not open until 6:00 am.  REMEMBER: Instructions that are not followed completely may result in serious medical risk, up to and including death; or upon the discretion of your surgeon and anesthesiologist your surgery may need to be rescheduled.  Do not eat food after midnight the night before surgery.  No gum chewing or hard candies.  You may however, drink CLEAR liquids up to 2 hours before you are scheduled to arrive for your surgery. Do not drink anything within 2 hours of your scheduled arrival time.  Clear liquids include: - water  - apple juice without pulp - gatorade (not RED colors) - black coffee or tea (Do NOT add milk or creamers to the coffee or tea) Do NOT drink anything that is not on this list.  In addition, your doctor has ordered for you to drink the provided:  Ensure Pre-Surgery Clear Carbohydrate Drink  Drinking this carbohydrate drink up to two hours before surgery helps to reduce insulin resistance and improve patient outcomes. Please complete drinking 2 hours before scheduled arrival time.  One week prior to surgery beginning 07/25/23:  Stop Anti-inflammatories (NSAIDS) such as meloxicam (MOBIC) , Advil, Aleve, Ibuprofen, Motrin, Naproxen, Naprosyn and Aspirin based products such as Excedrin, Goody's Powder, BC Powder. You may take Tylenol if needed for pain up until the day of surgery.  Stop ANY OVER THE COUNTER supplements until after surgery.  ON THE DAY OF SURGERY ONLY TAKE THESE MEDICATIONS WITH SIPS OF WATER:  atorvastatin (LIPITOR)  tiZANidine (ZANAFLEX)    No Alcohol for 24 hours before or after surgery.  No Smoking including  e-cigarettes for 24 hours before surgery.  No chewable tobacco products for at least 6 hours before surgery.  No nicotine patches on the day of surgery.  Do not use any "recreational" drugs for at least a week (preferably 2 weeks) before your surgery.  Please be advised that the combination of cocaine and anesthesia may have negative outcomes, up to and including death. If you test positive for cocaine, your surgery will be cancelled.  On the morning of surgery brush your teeth with toothpaste and water, you may rinse your mouth with mouthwash if you wish. Do not swallow any toothpaste or mouthwash.  Use CHG Soap or wipes as directed on instruction sheet.  Do not wear jewelry, make-up, hairpins, clips or nail polish.  For welded (permanent) jewelry: bracelets, anklets, waist bands, etc.  Please have this removed prior to surgery.  If it is not removed, there is a chance that hospital personnel will need to cut it off on the day of surgery.  Do not wear lotions, powders, or perfumes.   Do not shave body hair from the neck down 48 hours before surgery.  Contact lenses, hearing aids and dentures may not be worn into surgery.  Do not bring valuables to the hospital. [facility] is not responsible for any missing/lost belongings or valuables.   Notify your doctor if there is any change in your medical condition (cold, fever, infection).  Wear comfortable clothing (specific to your surgery type) to the hospital.  After surgery, you can help prevent lung complications by doing  breathing exercises.  Take deep breaths and cough every 1-2 hours. Your doctor may order a device called an Incentive Spirometer to help you take deep breaths. When coughing or sneezing, hold a pillow firmly against your incision with both hands. This is called "splinting." Doing this helps protect your incision. It also decreases belly discomfort.  If you are being admitted to the hospital overnight, leave your  suitcase in the car. After surgery it may be brought to your room.  In case of increased patient census, it may be necessary for you, the patient, to continue your postoperative care in the Same Day Surgery department.  If you are being discharged the day of surgery, you will not be allowed to drive home. You will need a responsible individual to drive you home and stay with you for 24 hours after surgery.   If you are taking public transportation, you will need to have a responsible individual with you.  Please call the Pre-admissions Testing Dept. at [phone] if you have any questions about these instructions.  Surgery Visitation Policy:  Patients having surgery or a procedure may have two visitors.  Children under the age of 41 must have an adult with them who is not the patient.  Inpatient Visitation:    Visiting hours are 7 a.m. to 8 p.m. Up to four visitors are allowed at one time in a patient room. The visitors may rotate out with other people during the day.  One visitor age 59 or older may stay with the patient overnight and must be in the room by 8 p.m.    Pre-operative 5 CHG Bath Instructions   You can play a key role in reducing the risk of infection after surgery. Your skin needs to be as free of germs as possible. You can reduce the number of germs on your skin by washing with CHG (chlorhexidine gluconate) soap before surgery. CHG is an antiseptic soap that kills germs and continues to kill germs even after washing.   DO NOT use if you have an allergy to chlorhexidine/CHG or antibacterial soaps. If your skin becomes reddened or irritated, stop using the CHG and notify one of our RNs at [phone].   Please shower with the CHG soap starting 4 days before surgery using the following schedule: 04/13 - 04/17.              Please keep in mind the following:  DO NOT shave, including legs and underarms, starting the day of your first shower.   You may shave your face  at any point before/day of surgery.  Place clean sheets on your bed the day you start using CHG soap. Use a clean washcloth (not used since being washed) for each shower. DO NOT sleep with pets once you start using the CHG.   CHG Shower Instructions:  If you choose to wash your hair and private area, wash first with your normal shampoo/soap.  After you use shampoo/soap, rinse your hair and body thoroughly to remove shampoo/soap residue.  Turn the water OFF and apply about 3 tablespoons (45 ml) of CHG soap to a CLEAN washcloth.  Apply CHG soap ONLY FROM YOUR NECK DOWN TO YOUR TOES (washing for 3-5 minutes)  DO NOT use CHG soap on face, private areas, open wounds, or sores.  Pay special attention to the area where your surgery is being performed.  If you are having back surgery, having someone wash your back for you may be helpful. Wait  2 minutes after CHG soap is applied, then you may rinse off the CHG soap.  Pat dry with a clean towel  Put on clean clothes/pajamas   If you choose to wear lotion, please use ONLY the CHG-compatible lotions on the back of this paper.     Additional instructions for the day of surgery: DO NOT APPLY any lotions, deodorants, cologne, or perfumes.   Put on clean/comfortable clothes.  Brush your teeth.  Ask your nurse before applying any prescription medications to the skin.      CHG Compatible Lotions   Aveeno Moisturizing lotion  Cetaphil Moisturizing Cream  Cetaphil Moisturizing Lotion  Clairol Herbal Essence Moisturizing Lotion, Dry Skin  Clairol Herbal Essence Moisturizing Lotion, Extra Dry Skin  Clairol Herbal Essence Moisturizing Lotion, Normal Skin  Curel Age Defying Therapeutic Moisturizing Lotion with Alpha Hydroxy  Curel Extreme Care Body Lotion  Curel Soothing Hands Moisturizing Hand Lotion  Curel Therapeutic Moisturizing Cream, Fragrance-Free  Curel Therapeutic Moisturizing Lotion, Fragrance-Free  Curel Therapeutic Moisturizing Lotion,  Original Formula  Eucerin Daily Replenishing Lotion  Eucerin Dry Skin Therapy Plus Alpha Hydroxy Crme  Eucerin Dry Skin Therapy Plus Alpha Hydroxy Lotion  Eucerin Original Crme  Eucerin Original Lotion  Eucerin Plus Crme Eucerin Plus Lotion  Eucerin TriLipid Replenishing Lotion  Keri Anti-Bacterial Hand Lotion  Keri Deep Conditioning Original Lotion Dry Skin Formula Softly Scented  Keri Deep Conditioning Original Lotion, Fragrance Free Sensitive Skin Formula  Keri Lotion Fast Absorbing Fragrance Free Sensitive Skin Formula  Keri Lotion Fast Absorbing Softly Scented Dry Skin Formula  Keri Original Lotion  Keri Skin Renewal Lotion Keri Silky Smooth Lotion  Keri Silky Smooth Sensitive Skin Lotion  Nivea Body Creamy Conditioning Oil  Nivea Body Extra Enriched Lotion  Nivea Body Original Lotion  Nivea Body Sheer Moisturizing Lotion Nivea Crme  Nivea Skin Firming Lotion  NutraDerm 30 Skin Lotion  NutraDerm Skin Lotion  NutraDerm Therapeutic Skin Cream  NutraDerm Therapeutic Skin Lotion  ProShield Protective Hand Cream  Provon moisturizing lotion  How to Use an Incentive Spirometer  An incentive spirometer is a tool that measures how well you are filling your lungs with each breath. Learning to take long, deep breaths using this tool can help you keep your lungs clear and active. This may help to reverse or lessen your chance of developing breathing (pulmonary) problems, especially infection. You may be asked to use a spirometer: After a surgery. If you have a lung problem or a history of smoking. After a long period of time when you have been unable to move or be active. If the spirometer includes an indicator to show the highest number that you have reached, your health care provider or respiratory therapist will help you set a goal. Keep a log of your progress as told by your health care provider. What are the risks? Breathing too quickly may cause dizziness or cause you to pass  out. Take your time so you do not get dizzy or light-headed. If you are in pain, you may need to take pain medicine before doing incentive spirometry. It is harder to take a deep breath if you are having pain. How to use your incentive spirometer  Sit up on the edge of your bed or on a chair. Hold the incentive spirometer so that it is in an upright position. Before you use the spirometer, breathe out normally. Place the mouthpiece in your mouth. Make sure your lips are closed tightly around it. Breathe in slowly and  as deeply as you can through your mouth, causing the piston or the ball to rise toward the top of the chamber. Hold your breath for 3-5 seconds, or for as long as possible. If the spirometer includes a coach indicator, use this to guide you in breathing. Slow down your breathing if the indicator goes above the marked areas. Remove the mouthpiece from your mouth and breathe out normally. The piston or ball will return to the bottom of the chamber. Rest for a few seconds, then repeat the steps 10 or more times. Take your time and take a few normal breaths between deep breaths so that you do not get dizzy or light-headed. Do this every 1-2 hours when you are awake. If the spirometer includes a goal marker to show the highest number you have reached (best effort), use this as a goal to work toward during each repetition. After each set of 10 deep breaths, cough a few times. This will help to make sure that your lungs are clear. If you have an incision on your chest or abdomen from surgery, place a pillow or a rolled-up towel firmly against the incision when you cough. This can help to reduce pain while taking deep breaths and coughing. General tips When you are able to get out of bed: Walk around often. Continue to take deep breaths and cough in order to clear your lungs. Keep using the incentive spirometer until your health care provider says it is okay to stop using it. If you have  been in the hospital, you may be told to keep using the spirometer at home. Contact a health care provider if: You are having difficulty using the spirometer. You have trouble using the spirometer as often as instructed. Your pain medicine is not giving enough relief for you to use the spirometer as told. You have a fever. Get help right away if: You develop shortness of breath. You develop a cough with bloody mucus from the lungs. You have fluid or blood coming from an incision site after you cough. Summary An incentive spirometer is a tool that can help you learn to take long, deep breaths to keep your lungs clear and active. You may be asked to use a spirometer after a surgery, if you have a lung problem or a history of smoking, or if you have been inactive for a long period of time. Use your incentive spirometer as instructed every 1-2 hours while you are awake. If you have an incision on your chest or abdomen, place a pillow or a rolled-up towel firmly against your incision when you cough. This will help to reduce pain. Get help right away if you have shortness of breath, you cough up bloody mucus, or blood comes from your incision when you cough. This information is not intended to replace advice given to you by your health care provider. Make sure you discuss any questions you have with your health care provider. Document Revised: 06/22/2019 Document Reviewed: 06/22/2019 Elsevier Patient Education  2023 Elsevier Inc.  Class available : 07/31/23

## 2023-07-31 NOTE — Anesthesia Preprocedure Evaluation (Signed)
 Anesthesia Evaluation  Patient identified by MRN, date of birth, ID band Patient awake    Reviewed: Allergy & Precautions, H&P , NPO status , Patient's Chart, lab work & pertinent test results  Airway Mallampati: III  TM Distance: >3 FB Neck ROM: full    Dental  (+) Chipped, Missing, Poor Dentition   Pulmonary sleep apnea , Current Smoker and Patient abstained from smoking.   Pulmonary exam normal        Cardiovascular Exercise Tolerance: Good hypertension, + CAD  Normal cardiovascular exam  Lexiscan myoview 05/31/2019 resulted low risk without ischemia  ECHO 05/01/2019  1. Left ventricular ejection fraction, by visual estimation, is 60 to  65%. The left ventricle has normal function. There is no left ventricular  hypertrophy. Normal diastolic function.   2. Global right ventricle has normal systolic function.The right  ventricular size is normal.   3. Left atrial size was normal.   4. Right atrial size was normal.   5. The mitral valve is normal in structure. No evidence of mitral valve  regurgitation.   6. The tricuspid valve is normal in structure.   7. The aortic valve is tricuspid. Aortic valve regurgitation is not  visualized. No evidence of aortic valve sclerosis or stenosis.   8. The pulmonic valve was not well visualized. Pulmonic valve  regurgitation is not visualized.   9. TR signal is inadequate for assessing pulmonary artery systolic  pressure.  10. 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) No date: Hypertension No date: Prostate cancer (HCC) No date: Sleep apnea No date: Stroke [facility])     Comment:  TIA No date: TIA (transient ischemic attack)  Past Surgical History: No date: COLONOSCOPY 2015: HIP SURGERY; Right No date: JOINT REPLACEMENT; Right     Comment:  hip No date: PROSTATE BIOPSY 08/20/2016: RADIOACTIVE SEED IMPLANT; N/A     Comment:  Procedure: RADIOACTIVE SEED IMPLANT/BRACHYTHERAPY               IMPLANT;  Surgeon: Dustin Gimenez, MD;  Location: [facility]               ORS;  Service: Urology;  Laterality: N/A; No date: TONSILLECTOMY     Reproductive/Obstetrics negative OB ROS                             Anesthesia Physical Anesthesia Plan  ASA: 3  Anesthesia Plan: Spinal   Post-op Pain Management: Tylenol PO (pre-op)*, Celebrex PO (pre-op)* and Gabapentin PO (pre-op)*   Induction: Intravenous  PONV Risk Score and Plan: Propofol infusion  Airway Management Planned: Natural Airway  Additional Equipment:   Intra-op Plan:   Post-operative Plan:   Informed Consent: I have reviewed the patients History and Physical, chart, labs and discussed the procedure including the risks, benefits and alternatives for the proposed anesthesia with the patient or authorized representative who has indicated his/her understanding  and acceptance.     Dental Advisory Given  Plan Discussed with: CRNA and Surgeon  Anesthesia Plan Comments:         Anesthesia Quick Evaluation

## 2023-08-01 ENCOUNTER — Ambulatory Visit

## 2023-08-01 ENCOUNTER — Encounter
Admission: RE | Disposition: A | Discharge status: Home / Self Care | Payer: Self-pay | Source: Home / Self Care | Attending: Orthopedic Surgery

## 2023-08-01 ENCOUNTER — Ambulatory Visit: Admitting: Anesthesiology

## 2023-08-01 ENCOUNTER — Ambulatory Visit: Payer: Self-pay | Admitting: Urgent Care

## 2023-08-01 ENCOUNTER — Ambulatory Visit
Admission: RE | Admit: 2023-08-01 | Discharge: 2023-08-02 | Disposition: A | Discharge status: Home / Self Care | Attending: Orthopedic Surgery | Admitting: Orthopedic Surgery

## 2023-08-01 ENCOUNTER — Encounter: Payer: Self-pay | Admitting: Orthopedic Surgery

## 2023-08-01 ENCOUNTER — Other Ambulatory Visit: Payer: Self-pay

## 2023-08-01 DIAGNOSIS — G4730 Sleep apnea, unspecified: Secondary | ICD-10-CM | POA: Insufficient documentation

## 2023-08-01 DIAGNOSIS — M1612 Unilateral primary osteoarthritis, left hip: Secondary | ICD-10-CM | POA: Insufficient documentation

## 2023-08-01 DIAGNOSIS — I10 Essential (primary) hypertension: Secondary | ICD-10-CM | POA: Insufficient documentation

## 2023-08-01 DIAGNOSIS — I2510 Atherosclerotic heart disease of native coronary artery without angina pectoris: Secondary | ICD-10-CM | POA: Insufficient documentation

## 2023-08-01 DIAGNOSIS — Z96642 Presence of left artificial hip joint: Secondary | ICD-10-CM | POA: Diagnosis present

## 2023-08-01 DIAGNOSIS — Z8673 Personal history of transient ischemic attack (TIA), and cerebral infarction without residual deficits: Secondary | ICD-10-CM | POA: Insufficient documentation

## 2023-08-01 DIAGNOSIS — F17210 Nicotine dependence, cigarettes, uncomplicated: Secondary | ICD-10-CM | POA: Diagnosis not present

## 2023-08-01 DIAGNOSIS — Z8546 Personal history of malignant neoplasm of prostate: Secondary | ICD-10-CM | POA: Diagnosis not present

## 2023-08-01 HISTORY — PX: TOTAL HIP ARTHROPLASTY: SHX124

## 2023-08-01 LAB — ABO/RH: ABO/RH(D): AB POS

## 2023-08-01 SURGERY — ARTHROPLASTY, HIP, TOTAL, ANTERIOR APPROACH
Anesthesia: Spinal | Site: Hip | Laterality: Left

## 2023-08-01 MED ORDER — SODIUM CHLORIDE 0.9 % IV SOLN: INTRAVENOUS | Status: DC

## 2023-08-01 MED ORDER — PHENOL 1.4 % MT LIQD: 1.0000 | OROMUCOSAL | Status: DC | PRN

## 2023-08-01 MED ORDER — EPHEDRINE 5 MG/ML INJ
INTRAVENOUS | Status: AC
  Filled 2023-08-01: qty 5

## 2023-08-01 MED ORDER — MIDAZOLAM HCL 2 MG/2ML IJ SOLN
INTRAMUSCULAR | Status: AC
  Filled 2023-08-01: qty 2

## 2023-08-01 MED ORDER — TRANEXAMIC ACID-NACL 1000-0.7 MG/100ML-% IV SOLN
INTRAVENOUS | Status: AC
Start: 2023-08-01 — End: ?
  Filled 2023-08-01: qty 100

## 2023-08-01 MED ORDER — DOCUSATE SODIUM 100 MG PO CAPS
100.0000 mg | ORAL_CAPSULE | Freq: Two times a day (BID) | ORAL | Status: DC
  Administered 2023-08-01 – 2023-08-02 (×2): 100 mg via ORAL
  Filled 2023-08-01: qty 1

## 2023-08-01 MED ORDER — ORAL CARE MOUTH RINSE: 15.0000 mL | Freq: Once | OROMUCOSAL | Status: AC

## 2023-08-01 MED ORDER — PROPOFOL 10 MG/ML IV BOLUS
INTRAVENOUS | Status: AC
  Filled 2023-08-01: qty 20

## 2023-08-01 MED ORDER — MORPHINE SULFATE (PF) 4 MG/ML IV SOLN
0.5000 mg | INTRAVENOUS | Status: DC | PRN
Start: 2023-08-01 — End: 2023-08-02

## 2023-08-01 MED ORDER — PHENYLEPHRINE HCL-NACL 20-0.9 MG/250ML-% IV SOLN
INTRAVENOUS | Status: AC
  Filled 2023-08-01: qty 250

## 2023-08-01 MED ORDER — FENTANYL CITRATE (PF) 100 MCG/2ML IJ SOLN
INTRAMUSCULAR | Status: AC
  Filled 2023-08-01: qty 2

## 2023-08-01 MED ORDER — KETOROLAC TROMETHAMINE 15 MG/ML IJ SOLN
INTRAMUSCULAR | Status: AC
  Filled 2023-08-01: qty 1

## 2023-08-01 MED ORDER — CHLORHEXIDINE GLUCONATE 0.12 % MT SOLN
15.0000 mL | Freq: Once | OROMUCOSAL | Status: AC
  Administered 2023-08-01: 15 mL via OROMUCOSAL

## 2023-08-01 MED ORDER — TAMSULOSIN HCL 0.4 MG PO CAPS
0.4000 mg | ORAL_CAPSULE | Freq: Every day | ORAL | Status: DC
  Administered 2023-08-01: 0.4 mg via ORAL

## 2023-08-01 MED ORDER — KETOROLAC TROMETHAMINE 15 MG/ML IJ SOLN
7.5000 mg | Freq: Four times a day (QID) | INTRAMUSCULAR | Status: AC
  Administered 2023-08-01 – 2023-08-02 (×4): 7.5 mg via INTRAVENOUS
  Filled 2023-08-01 (×3): qty 1

## 2023-08-01 MED ORDER — SODIUM CHLORIDE (PF) 0.9 % IJ SOLN
INTRAMUSCULAR | Status: AC
  Filled 2023-08-01: qty 10

## 2023-08-01 MED ORDER — BUPIVACAINE HCL (PF) 0.5 % IJ SOLN
INTRAMUSCULAR | Status: AC
  Filled 2023-08-01: qty 10

## 2023-08-01 MED ORDER — ACETAMINOPHEN 10 MG/ML IV SOLN: 1000.0000 mg | Freq: Once | INTRAVENOUS | Status: DC | PRN

## 2023-08-01 MED ORDER — SURGIRINSE WOUND IRRIGATION SYSTEM - OPTIME
TOPICAL | Status: DC | PRN
  Administered 2023-08-01: 900 mL via TOPICAL

## 2023-08-01 MED ORDER — ACETAMINOPHEN 500 MG PO TABS
ORAL_TABLET | ORAL | Status: AC
  Filled 2023-08-01: qty 2

## 2023-08-01 MED ORDER — PROPOFOL 500 MG/50ML IV EMUL
INTRAVENOUS | Status: DC | PRN
  Administered 2023-08-01: 60 ug/kg/min via INTRAVENOUS

## 2023-08-01 MED ORDER — LIDOCAINE HCL (CARDIAC) PF 100 MG/5ML IV SOSY
PREFILLED_SYRINGE | INTRAVENOUS | Status: DC | PRN
  Administered 2023-08-01: 100 mg via INTRAVENOUS

## 2023-08-01 MED ORDER — TRAMADOL HCL 50 MG PO TABS: 50.0000 mg | ORAL_TABLET | Freq: Four times a day (QID) | ORAL | Status: DC | PRN

## 2023-08-01 MED ORDER — CEFAZOLIN SODIUM-DEXTROSE 2-4 GM/100ML-% IV SOLN
2.0000 g | Freq: Four times a day (QID) | INTRAVENOUS | Status: AC
  Administered 2023-08-01 (×2): 2 g via INTRAVENOUS
  Filled 2023-08-01: qty 100

## 2023-08-01 MED ORDER — ONDANSETRON HCL 4 MG/2ML IJ SOLN
INTRAMUSCULAR | Status: DC | PRN
  Administered 2023-08-01: 4 mg via INTRAVENOUS

## 2023-08-01 MED ORDER — SODIUM CHLORIDE (PF) 0.9 % IJ SOLN
INTRAMUSCULAR | Status: DC | PRN
  Administered 2023-08-01: 50 mL via INTRAMUSCULAR

## 2023-08-01 MED ORDER — GABAPENTIN 100 MG PO CAPS
ORAL_CAPSULE | ORAL | Status: AC
Start: 2023-08-01 — End: ?
  Filled 2023-08-01: qty 3

## 2023-08-01 MED ORDER — GABAPENTIN 300 MG PO CAPS
300.0000 mg | ORAL_CAPSULE | Freq: Once | ORAL | Status: AC
Start: 2023-08-01 — End: 2023-08-01
  Administered 2023-08-01: 300 mg via ORAL

## 2023-08-01 MED ORDER — LACTATED RINGERS IV SOLN: INTRAVENOUS | Status: DC

## 2023-08-01 MED ORDER — OXYCODONE HCL 5 MG PO TABS
ORAL_TABLET | ORAL | Status: AC
  Filled 2023-08-01: qty 1

## 2023-08-01 MED ORDER — FENTANYL CITRATE (PF) 100 MCG/2ML IJ SOLN
INTRAMUSCULAR | Status: DC | PRN
  Administered 2023-08-01 (×4): 25 ug via INTRAVENOUS

## 2023-08-01 MED ORDER — EPHEDRINE SULFATE-NACL 50-0.9 MG/10ML-% IV SOSY
PREFILLED_SYRINGE | INTRAVENOUS | Status: DC | PRN
  Administered 2023-08-01: 5 mg via INTRAVENOUS
  Administered 2023-08-01 (×2): 10 mg via INTRAVENOUS

## 2023-08-01 MED ORDER — MIDAZOLAM HCL 5 MG/5ML IJ SOLN
INTRAMUSCULAR | Status: DC | PRN
  Administered 2023-08-01: 2 mg via INTRAVENOUS

## 2023-08-01 MED ORDER — DEXMEDETOMIDINE HCL IN NACL 80 MCG/20ML IV SOLN
INTRAVENOUS | Status: AC
  Filled 2023-08-01: qty 20

## 2023-08-01 MED ORDER — METOCLOPRAMIDE HCL 5 MG/ML IJ SOLN: 5.0000 mg | Freq: Three times a day (TID) | INTRAMUSCULAR | Status: DC | PRN

## 2023-08-01 MED ORDER — TRANEXAMIC ACID-NACL 1000-0.7 MG/100ML-% IV SOLN
INTRAVENOUS | Status: DC | PRN
  Administered 2023-08-01: 1000 mg via INTRAVENOUS

## 2023-08-01 MED ORDER — OXYCODONE HCL 5 MG PO TABS
5.0000 mg | ORAL_TABLET | Freq: Once | ORAL | Status: AC | PRN
  Administered 2023-08-01: 5 mg via ORAL

## 2023-08-01 MED ORDER — DEXAMETHASONE SODIUM PHOSPHATE 10 MG/ML IJ SOLN
INTRAMUSCULAR | Status: AC
  Filled 2023-08-01: qty 1

## 2023-08-01 MED ORDER — CEFAZOLIN SODIUM-DEXTROSE 3-4 GM/150ML-% IV SOLN
3.0000 g | INTRAVENOUS | Status: AC
Start: 2023-08-01 — End: 2023-08-01
  Administered 2023-08-01: 3 g via INTRAVENOUS
  Filled 2023-08-01: qty 150

## 2023-08-01 MED ORDER — TRANEXAMIC ACID-NACL 1000-0.7 MG/100ML-% IV SOLN
1000.0000 mg | INTRAVENOUS | Status: AC
  Administered 2023-08-01: 1000 mg via INTRAVENOUS

## 2023-08-01 MED ORDER — DOCUSATE SODIUM 100 MG PO CAPS
ORAL_CAPSULE | ORAL | Status: AC
  Filled 2023-08-01: qty 1

## 2023-08-01 MED ORDER — ACETAMINOPHEN 325 MG PO TABS: 325.0000 mg | ORAL_TABLET | Freq: Four times a day (QID) | ORAL | Status: DC | PRN

## 2023-08-01 MED ORDER — CEFAZOLIN SODIUM-DEXTROSE 2-4 GM/100ML-% IV SOLN
INTRAVENOUS | Status: AC
  Filled 2023-08-01: qty 100

## 2023-08-01 MED ORDER — ACETAMINOPHEN 10 MG/ML IV SOLN
INTRAVENOUS | Status: AC
  Filled 2023-08-01: qty 100

## 2023-08-01 MED ORDER — ONDANSETRON HCL 4 MG/2ML IJ SOLN
INTRAMUSCULAR | Status: AC
  Filled 2023-08-01: qty 2

## 2023-08-01 MED ORDER — PROPOFOL 10 MG/ML IV BOLUS
INTRAVENOUS | Status: DC | PRN
  Administered 2023-08-01: 40 mg via INTRAVENOUS

## 2023-08-01 MED ORDER — CELECOXIB 200 MG PO CAPS
200.0000 mg | ORAL_CAPSULE | Freq: Once | ORAL | Status: AC
  Administered 2023-08-01: 200 mg via ORAL

## 2023-08-01 MED ORDER — PHENYLEPHRINE HCL-NACL 20-0.9 MG/250ML-% IV SOLN
INTRAVENOUS | Status: DC | PRN
  Administered 2023-08-01: 30 ug/min via INTRAVENOUS

## 2023-08-01 MED ORDER — ONDANSETRON HCL 4 MG PO TABS: 4.0000 mg | ORAL_TABLET | Freq: Four times a day (QID) | ORAL | Status: DC | PRN

## 2023-08-01 MED ORDER — 0.9 % SODIUM CHLORIDE (POUR BTL) OPTIME
TOPICAL | Status: DC | PRN
  Administered 2023-08-01: 500 mL

## 2023-08-01 MED ORDER — PANTOPRAZOLE SODIUM 40 MG PO TBEC
40.0000 mg | DELAYED_RELEASE_TABLET | Freq: Every day | ORAL | Status: DC
  Administered 2023-08-01 – 2023-08-02 (×2): 40 mg via ORAL
  Filled 2023-08-01 (×2): qty 1

## 2023-08-01 MED ORDER — OXYCODONE HCL 5 MG/5ML PO SOLN: 5.0000 mg | Freq: Once | ORAL | Status: AC | PRN

## 2023-08-01 MED ORDER — DEXAMETHASONE SODIUM PHOSPHATE 10 MG/ML IJ SOLN
8.0000 mg | Freq: Once | INTRAMUSCULAR | Status: AC
  Administered 2023-08-01: 3 mg via INTRAVENOUS
  Administered 2023-08-01: 5 mg via INTRAVENOUS

## 2023-08-01 MED ORDER — ONDANSETRON HCL 4 MG/2ML IJ SOLN: 4.0000 mg | Freq: Four times a day (QID) | INTRAMUSCULAR | Status: DC | PRN

## 2023-08-01 MED ORDER — PHENYLEPHRINE 80 MCG/ML (10ML) SYRINGE FOR IV PUSH (FOR BLOOD PRESSURE SUPPORT)
PREFILLED_SYRINGE | INTRAVENOUS | Status: DC | PRN
  Administered 2023-08-01: 160 ug via INTRAVENOUS
  Administered 2023-08-01 (×3): 80 ug via INTRAVENOUS
  Administered 2023-08-01 (×2): 160 ug via INTRAVENOUS
  Administered 2023-08-01: 80 ug via INTRAVENOUS

## 2023-08-01 MED ORDER — DROPERIDOL 2.5 MG/ML IJ SOLN: 0.6250 mg | Freq: Once | INTRAMUSCULAR | Status: DC | PRN

## 2023-08-01 MED ORDER — HYDROCODONE-ACETAMINOPHEN 5-325 MG PO TABS
1.0000 | ORAL_TABLET | ORAL | Status: DC | PRN
  Administered 2023-08-01: 1 via ORAL
  Administered 2023-08-01 – 2023-08-02 (×2): 2 via ORAL
  Administered 2023-08-02: 1 via ORAL
  Filled 2023-08-01: qty 2
  Filled 2023-08-01: qty 1
  Filled 2023-08-01 (×2): qty 2

## 2023-08-01 MED ORDER — CHLORHEXIDINE GLUCONATE 0.12 % MT SOLN
OROMUCOSAL | Status: AC
  Filled 2023-08-01: qty 15

## 2023-08-01 MED ORDER — GABAPENTIN 300 MG PO CAPS
ORAL_CAPSULE | ORAL | Status: AC
  Filled 2023-08-01: qty 1

## 2023-08-01 MED ORDER — ACETAMINOPHEN 10 MG/ML IV SOLN
INTRAVENOUS | Status: DC | PRN
  Administered 2023-08-01: 1000 mg via INTRAVENOUS

## 2023-08-01 MED ORDER — ACETAMINOPHEN 500 MG PO TABS
1000.0000 mg | ORAL_TABLET | Freq: Three times a day (TID) | ORAL | Status: DC
  Administered 2023-08-02: 1000 mg via ORAL
  Administered 2023-08-02: 325 mg via ORAL
  Filled 2023-08-01: qty 2

## 2023-08-01 MED ORDER — CELECOXIB 200 MG PO CAPS
ORAL_CAPSULE | ORAL | Status: AC
  Filled 2023-08-01: qty 1

## 2023-08-01 MED ORDER — KETAMINE HCL 50 MG/5ML IJ SOSY
PREFILLED_SYRINGE | INTRAMUSCULAR | Status: DC | PRN
  Administered 2023-08-01 (×2): 10 mg via INTRAVENOUS
  Administered 2023-08-01: 20 mg via INTRAVENOUS
  Administered 2023-08-01: 10 mg via INTRAVENOUS

## 2023-08-01 MED ORDER — BUPIVACAINE-EPINEPHRINE (PF) 0.25% -1:200000 IJ SOLN
INTRAMUSCULAR | Status: AC
  Filled 2023-08-01: qty 30

## 2023-08-01 MED ORDER — BUPIVACAINE LIPOSOME 1.3 % IJ SUSP
INTRAMUSCULAR | Status: AC
  Filled 2023-08-01: qty 20

## 2023-08-01 MED ORDER — METOCLOPRAMIDE HCL 5 MG PO TABS: 5.0000 mg | ORAL_TABLET | Freq: Three times a day (TID) | ORAL | Status: DC | PRN

## 2023-08-01 MED ORDER — TAMSULOSIN HCL 0.4 MG PO CAPS
ORAL_CAPSULE | ORAL | Status: AC
  Filled 2023-08-01: qty 1

## 2023-08-01 MED ORDER — MENTHOL 3 MG MT LOZG: 1.0000 | LOZENGE | OROMUCOSAL | Status: DC | PRN

## 2023-08-01 MED ORDER — ATORVASTATIN CALCIUM 20 MG PO TABS
80.0000 mg | ORAL_TABLET | Freq: Every day | ORAL | Status: DC
  Administered 2023-08-01 – 2023-08-02 (×2): 80 mg via ORAL
  Filled 2023-08-01 (×2): qty 8

## 2023-08-01 MED ORDER — DEXMEDETOMIDINE HCL IN NACL 80 MCG/20ML IV SOLN
INTRAVENOUS | Status: DC | PRN
Start: 2023-08-01 — End: 2023-08-01
  Administered 2023-08-01 (×2): 4 ug via INTRAVENOUS
  Administered 2023-08-01: 8 ug via INTRAVENOUS
  Administered 2023-08-01: 4 ug via INTRAVENOUS

## 2023-08-01 MED ORDER — KETAMINE HCL 50 MG/5ML IJ SOSY
PREFILLED_SYRINGE | INTRAMUSCULAR | Status: AC
  Filled 2023-08-01: qty 5

## 2023-08-01 MED ORDER — SODIUM CHLORIDE 0.9 % IR SOLN
Status: DC | PRN
  Administered 2023-08-01: 100 mL

## 2023-08-01 MED ORDER — PROPOFOL 1000 MG/100ML IV EMUL
INTRAVENOUS | Status: AC
  Filled 2023-08-01: qty 100

## 2023-08-01 MED ORDER — FENTANYL CITRATE (PF) 100 MCG/2ML IJ SOLN
25.0000 ug | INTRAMUSCULAR | Status: DC | PRN
  Administered 2023-08-01 (×5): 25 ug via INTRAVENOUS

## 2023-08-01 MED ORDER — ACETAMINOPHEN 500 MG PO TABS
1000.0000 mg | ORAL_TABLET | Freq: Once | ORAL | Status: AC
  Administered 2023-08-01: 1000 mg via ORAL

## 2023-08-01 MED ORDER — ENOXAPARIN SODIUM 40 MG/0.4ML IJ SOSY
40.0000 mg | PREFILLED_SYRINGE | INTRAMUSCULAR | Status: DC
  Administered 2023-08-02: 40 mg via SUBCUTANEOUS
  Filled 2023-08-01: qty 0.4

## 2023-08-01 MED ORDER — BUPIVACAINE HCL (PF) 0.5 % IJ SOLN
INTRAMUSCULAR | Status: DC | PRN
  Administered 2023-08-01: 2.5 mL

## 2023-08-01 MED ORDER — TRANEXAMIC ACID-NACL 1000-0.7 MG/100ML-% IV SOLN
INTRAVENOUS | Status: AC
  Filled 2023-08-01: qty 100

## 2023-08-01 MED ORDER — LIDOCAINE HCL (PF) 2 % IJ SOLN
INTRAMUSCULAR | Status: AC
  Filled 2023-08-01: qty 5

## 2023-08-01 MED ORDER — PHENYLEPHRINE 80 MCG/ML (10ML) SYRINGE FOR IV PUSH (FOR BLOOD PRESSURE SUPPORT)
PREFILLED_SYRINGE | INTRAVENOUS | Status: AC
  Filled 2023-08-01: qty 10

## 2023-08-01 SURGICAL SUPPLY — 63 items
BLADE CLIPPER SURG (BLADE) IMPLANT
BLADE SAGITTAL AGGR TOOTH XLG (BLADE) ×1 IMPLANT
BNDG COHESIVE 6X5 TAN ST LF (GAUZE/BANDAGES/DRESSINGS) ×2 IMPLANT
BRUSH SCRUB EZ PLAIN DRY (MISCELLANEOUS) ×1 IMPLANT
CHLORAPREP W/TINT 26 (MISCELLANEOUS) ×1 IMPLANT
DERMABOND ADVANCED .7 DNX12 (GAUZE/BANDAGES/DRESSINGS) ×1 IMPLANT
DRAPE C-ARM XRAY 36X54 (DRAPES) ×1 IMPLANT
DRAPE SHEET LG 3/4 BI-LAMINATE (DRAPES) ×3 IMPLANT
DRAPE TABLE BACK 80X90 (DRAPES) ×1 IMPLANT
DRSG MEPILEX SACRM 8.7X9.8 (GAUZE/BANDAGES/DRESSINGS) ×1 IMPLANT
DRSG OPSITE POSTOP 4X8 (GAUZE/BANDAGES/DRESSINGS) ×1 IMPLANT
ELECT BLADE 4.0 EZ CLEAN MEGAD (MISCELLANEOUS) ×1 IMPLANT
ELECT REM PT RETURN 9FT ADLT (ELECTROSURGICAL) ×1 IMPLANT
ELECTRODE BLDE 4.0 EZ CLN MEGD (MISCELLANEOUS) ×1 IMPLANT
ELECTRODE REM PT RTRN 9FT ADLT (ELECTROSURGICAL) ×1 IMPLANT
GLOVE BIO SURGEON STRL SZ8 (GLOVE) ×1 IMPLANT
GLOVE BIOGEL PI IND STRL 8 (GLOVE) ×1 IMPLANT
GLOVE PI ORTHO PRO STRL 7.5 (GLOVE) ×2 IMPLANT
GLOVE PI ORTHO PRO STRL SZ8 (GLOVE) ×2 IMPLANT
GLOVE SURG SYN 7.5 E (GLOVE) ×1 IMPLANT
GLOVE SURG SYN 7.5 PF PI (GLOVE) ×1 IMPLANT
GOWN SRG XL LVL 3 NONREINFORCE (GOWNS) ×1 IMPLANT
GOWN STRL REUS W/ TWL LRG LVL3 (GOWN DISPOSABLE) ×1 IMPLANT
GOWN STRL REUS W/ TWL XL LVL3 (GOWN DISPOSABLE) ×1 IMPLANT
HEAD CERAMIC FEMORAL 36MM (Head) IMPLANT
HOOD PEEL AWAY T7 (MISCELLANEOUS) ×2 IMPLANT
INSERT TRIDENT POLY 36 0DEG (Insert) IMPLANT
IV NS 100ML SINGLE PACK (IV SOLUTION) ×1 IMPLANT
KIT PATIENT CARE HANA TABLE (KITS) ×1 IMPLANT
KIT TURNOVER CYSTO (KITS) ×1 IMPLANT
LIGHT WAVEGUIDE WIDE FLAT (MISCELLANEOUS) ×1 IMPLANT
MANIFOLD NEPTUNE II (INSTRUMENTS) ×1 IMPLANT
MARKER SKIN DUAL TIP RULER LAB (MISCELLANEOUS) ×1 IMPLANT
MAT ABSORB FLUID 56X50 GRAY (MISCELLANEOUS) ×1 IMPLANT
NDL SPNL 20GX3.5 QUINCKE YW (NEEDLE) ×1 IMPLANT
NEEDLE SPNL 20GX3.5 QUINCKE YW (NEEDLE) ×1 IMPLANT
NS IRRIG 500ML POUR BTL (IV SOLUTION) ×1 IMPLANT
PACK HIP COMPR (MISCELLANEOUS) ×1 IMPLANT
PAD ARMBOARD POSITIONER FOAM (MISCELLANEOUS) ×1 IMPLANT
PENCIL SMOKE EVACUATOR (MISCELLANEOUS) ×1 IMPLANT
SCREW HEX LP 6.5X20 (Screw) IMPLANT
SCREW HEX LP 6.5X30 (Screw) IMPLANT
SHELL CLUSTERHOLE ACETABULAR 5 (Shell) IMPLANT
SLEEVE SCD COMPRESS KNEE MED (STOCKING) ×1 IMPLANT
SOLUTION IRRIG SURGIPHOR (IV SOLUTION) ×1 IMPLANT
STEM STD OFFSET SZ2 32.5 (Stem) IMPLANT
SURGIFLO W/THROMBIN 8M KIT (HEMOSTASIS) IMPLANT
SUT BONE WAX W31G (SUTURE) ×1 IMPLANT
SUT ETHIBOND 2 V 37 (SUTURE) ×1 IMPLANT
SUT SILK 0 30XBRD TIE 6 (SUTURE) ×1 IMPLANT
SUT STRATA 1 CT-1 DLB (SUTURE) ×1 IMPLANT
SUT STRATAFIX 14 PDO 36 VLT (SUTURE) ×1 IMPLANT
SUT STRATAFIX PDO 1 14 VIOLET (SUTURE) ×1 IMPLANT
SUT VIC AB 0 CT1 36 (SUTURE) ×1 IMPLANT
SUT VIC AB 2-0 CT2 27 (SUTURE) ×1 IMPLANT
SUTURE STRATA 1 CT-1 DLB (SUTURE) ×1 IMPLANT
SUTURE STRATA SPIR 4-0 18 (SUTURE) ×1 IMPLANT
SYR 20ML LL LF (SYRINGE) ×2 IMPLANT
TAPE MICROFOAM 4IN (TAPE) IMPLANT
TOWEL OR 17X26 4PK STRL BLUE (TOWEL DISPOSABLE) IMPLANT
TRAP FLUID SMOKE EVACUATOR (MISCELLANEOUS) ×1 IMPLANT
WAND WEREWOLF FASTSEAL 6.0 (MISCELLANEOUS) ×1 IMPLANT
WATER STERILE IRR 1000ML POUR (IV SOLUTION) ×1 IMPLANT

## 2023-08-01 NOTE — Addendum Note (Signed)
 Addendum  created 08/01/23 1338 by Allayne Arabian, CRNA   Clinical Note Signed, Intraprocedure Blocks edited, SmartForm saved

## 2023-08-01 NOTE — Transfer of Care (Signed)
 Immediate Anesthesia Transfer of Care Note  Patient: Bradley Shaw  Procedure(s) Performed: ARTHROPLASTY, HIP, TOTAL, ANTERIOR APPROACH (Left: Hip)  Patient Location: PACU  Anesthesia Type:Spinal  Level of Consciousness: awake  Airway & Oxygen Therapy: Patient Spontanous Breathing and Patient connected to face mask oxygen  Post-op Assessment: Report given to RN and Post -op Vital signs reviewed and stable  Post vital signs: Reviewed and stable  Last Vitals:  Vitals Value Taken Time  BP 145/64 08/01/23 1005  Temp    Pulse 84 08/01/23 1005  Resp 15 08/01/23 1005  SpO2 90 % 08/01/23 1005  Vitals shown include unfiled device data.  Last Pain:  Vitals:   08/01/23 0617  TempSrc: Temporal  PainSc: 0-No pain         Complications: No notable events documented.

## 2023-08-01 NOTE — Plan of Care (Signed)
 Continue education.

## 2023-08-01 NOTE — Op Note (Signed)
 Patient Name: Bradley Shaw  KGU:[id number]  Pre-Operative Diagnosis: Left hip Osteoarthritis  Post-Operative Diagnosis: (same)  Procedure: Left Total Hip Arthroplasty  Components/Implants: Cup: Trident Tritanium Clusterhole 52/E w/x 2 screws    Liner: Neutral X3 Poly 36/E  Stem: Insignia #2 std offset  Head:Biolox ceramic 36mm +0  Date of Surgery: 08/01/2023  Surgeon: Reinaldo Berber MD  Assistant: [facility] RNFA (present and scrubbed throughout the case, critical for assistance with exposure, retraction, instrumentation, and closure)   Anesthesiologist: Laural Benes  Anesthesia: Spinal   EBL: 100cc  IVF:700cc  Complications: None   Brief history: The patient is a 58 year old male with a history of osteoarthritis of the left hip with pain limiting their range of motion and activities of daily living, which has failed multiple attempts at conservative therapy.  The risks and benefits of total hip arthroplasty as definitive surgical treatment were discussed with the patient, who opted to proceed with the operation.  After outpatient medical clearance and optimization was completed the patient was admitted to [facility] for the procedure.  All preoperative films were reviewed and an appropriate surgical plan was made prior to surgery.   Description of procedure: The patient was brought to the operating room where laterality was confirmed by all those present to be the left side.  The patient was administered spinal anesthesia on a stretcher prior to being moved supine on the operating room table. Patient was given an intravenous dose of antibiotics for surgical prophylaxis and TXA.  All bony prominences and extremities were well padded and the patient was securely attached to the table boots, a perineal post was placed and the patient had a safety strap placed.  Surgical site was prepped with alcohol and chlorhexidine. The surgical site over the hip was and draped in  typical sterile fashion with multiple layers of adhesive and nonadhesive drapes.  The incision site was marked out with a sterile marker and care was taken to assess the position of the ASIS and ensure appropriate position for the incision.    A surgical timeout was then called with participation of all staff in the room the patient was then a confirmed again and laterality confirmed.  Incision was made over the anterior lateral aspect of the proximal thigh in line with the TFL.  Appropriate retractors were placed and all bleeding vessels were coagulated within the subcutaneous and fatty layers.  An incision was made in the TFL fascia in the interval was carefully identified.  The lateral ascending branches of the circumflex vessels were identified, cauterized and carefully dissected. The main vessels were then tied with a 0 silk hand tie.  Retractors were placed around the superior lateral and inferior medial aspects of the femoral neck and a capsulotomy was performed exposing the hip joint.  Retraction stitches were placed and the capsulotomy to assist with visualization.  Femoral neck cut was then made and the femoral head was extracted after placing the leg in traction.  Bone wax was then applied to the proximal cut surface of the femur and aqua mantis was used to address any bleeding around the femoral neck cut.  Retractors were then placed around the acetabulum to fully visualize the joint space, and the remaining labral tissue was removed and pulvinar was removed.   The acetabulum was then sequentially reamed up to the appropriate size in order to get good fit and fill for the acetabular component while under fluoroscopic guidance.  Acetabular component was then placed and malleted into  a secure fit while confirming position and abduction angle and anteversion utilizing fluoroscopy.  2 screws were then placed in the acetabular cup to assist in securing the cup in place. The cup was irrigated,  a real  neutral liner was placed, impacted, and checked for stability. The femur traction was dropped and sequentially externally rotated while performing a release of the posterior and superomedial tissues off of the proximal femur to allow for mobility, care was taken to preserve the external rotators and piriformis attachments.  The remaining interval between the abductors and the capsule was dissected out and a retractor was placed over the superolateral aspect of the femur over the greater trochanter.  The leg was carefully brought down into extension and adducted to provide visualization of the proximal femur for broaching.  The femur was then sequentially broached up to an appropriate size which provided for good fill and stability to the femoral broach.  A trial neck and head were placed on the femoral broach and the leg was brought up for reduction.  The hip was reduced and manual check of stability was performed.  The hip was found to be stable in flexion internal rotation and extension external rotation.  Leg lengths were confirmed on fluoroscopy.   The hip was then dislocated the trial neck and head were removed.  The leg was then brought down into extension and adduction in the proximal femur was reexposed.  The broach trial was removed and the femur was irrigated with normal saline prior to the real femoral stem being implanted.  After the femoral stem was seated and shown to have good fit and fill the appropriate head was impacted the leg was brought up and reduced.  There was good range of motion with stability in flexion internal rotation and extension external rotation on testing.  Leg lengths were found to be appropriate on fluoroscopic evaluation at this time.  The hip was then irrigated with betdine based surgiphor solution and then saline solution.  The capsulotomy was repaired with Ethibond sutures.  A pericapsular and peritrochanteric cocktail with Exparel and bupivacaine was then injected as well  as the subcutaneous tissues. The fascia was closed with a #1 barbed running suture.  The deep tissues were closed with Vicryl sutures the subcutaneous tissues were closed with interrupted Vicryl sutures and a running barbed 4-0 suture.  The skin was then reinforced with Dermabond and a sterile dressing was placed.   The patient was awoken from anesthesia transferred off of the operating room table onto a hospital bed where examination of leg lengths found the leg lengths to be equal with a good distal pulse.  The patient was then transferred to the PACU in stable condition.

## 2023-08-01 NOTE — Anesthesia Procedure Notes (Addendum)
 Spinal  Patient location during procedure: OR Start time: 08/01/2023 7:37 AM End time: 08/01/2023 7:42 AM Reason for block: surgical anesthesia Staffing Performed: resident/CRNA  Resident/CRNA: Allayne Arabian, CRNA Performed by: Allayne Arabian, CRNA Authorized by: Baltazar Bonier, MD   Preanesthetic Checklist Completed: patient identified, IV checked, site marked, risks and benefits discussed, surgical consent, monitors and equipment checked, pre-op evaluation and timeout performed Spinal Block Patient position: sitting Prep: ChloraPrep Patient monitoring: heart rate, continuous pulse ox, blood pressure and cardiac monitor Approach: midline Location: L2-3 Injection technique: single-shot Needle Needle type: Quincke  Needle gauge: 22 G Needle length: 9 cm Needle insertion depth: 9 cm Assessment Sensory level: T6 Events: CSF return Additional Notes Negative paresthesia. Negative blood return. Positive free-flowing CSF. Expiration date of kit checked and confirmed. Patient tolerated procedure well, without complications.  QIO#[id number] Exp. 5/26

## 2023-08-01 NOTE — Interval H&P Note (Signed)
Patient history and physical updated. Consent reviewed including risks, benefits, and alternatives to surgery. Patient agrees with above plan to proceed with left anterior total hip arthroplasty  

## 2023-08-01 NOTE — H&P (Signed)
 History of Present Illness: Bradley Shaw is an 58 y.o. male presents for history and physical for left anterior total hip arthroplasty with Dr. Clyda Dark on 08/01/2023. Patient has advanced left hip osteoarthritis that has been severe and debilitating. His arthritis shows collapse of the femoral head. His pain is 10 out of 10 and he has had no relief with Tylenol or over-the-counter NSAIDs. His pain is interfering with his quality of life and all activities daily living. Despite severe pain patient has been working to lose weight. He has seen Dr. Clyda Dark discussed total hip arthroplasty and agreed and consented procedure.  Patient reports he smokes a few cigarettes a day but is planning to quit today, patient has a BMI of 41 and he is a nondiabetic with a last A1c of 5.6.  Past Medical History: Past Medical History:  Diagnosis Date  Arthritis  rt leg  Asthma without status asthmaticus (HHS-HCC)  as a child out grew it  Benign essential HTN 06/30/2021  Coronary artery disease involving native coronary artery of native heart 06/30/2021  Obesity  Tobacco use   Past Surgical History: Past Surgical History:  Procedure Laterality Date  ARTHROSCOPY KNEE W/MENISCECTOMY Left 07/09/2012  Procedure: ARTHRS KNE SURG W/MENISCECTOMY MED/LAT W/SHVG; Surgeon: Hettie Lota, MD; Location: [facility] OR; Service: Orthopedics; Laterality: Left;  Right total hip replacement, direct anterior approach Right 04/01/14  Dr. Mozell Arias  UMBILICAL HERNIA REPAIR  Childhood   Past Family History: Family History  Problem Relation Age of Onset  High blood pressure (Hypertension) Mother  Myocardial Infarction (Heart attack) Father 36  Diabetes type II Sister   Medications: Current Outpatient Medications  Medication Sig Dispense Refill  aspirin 81 MG EC tablet Take by mouth  atorvastatin (LIPITOR) 80 MG tablet Take 1 tablet (80 mg total) by mouth once daily 90 tablet 3  ibuprofen (MOTRIN) 200 MG tablet Take 200 mg by  mouth every 6 (six) hours as needed for Pain  meloxicam (MOBIC) 15 MG tablet Take 1 tablet (15 mg total) by mouth once daily 30 tablet 11  sildenafiL (VIAGRA) 50 MG tablet Take 1 tablet (50 mg total) by mouth once daily as needed for Erectile Dysfunction for up to 30 days 10 tablet 0  tamsulosin (FLOMAX) 0.4 mg capsule Take 0.4 mg by mouth once daily Take 30 minutes after same meal each day.  tiZANidine (ZANAFLEX) 2 MG tablet TAKE 1 TABLET (2 MG TOTAL) BY MOUTH 3 (THREE) TIMES DAILY AS NEEDED FOR MUSCLE SPASMS 60 tablet 0   No current facility-administered medications for this visit.   Allergies: Allergies  Allergen Reactions  Shellfish Containing Products Anaphylaxis    Visit Vitals: Vitals:  07/17/23 1001  BP: (!) 140/78    Review of Systems:  A comprehensive 14 point ROS was performed, reviewed, and the pertinent orthopaedic findings are documented in the HPI.  Physical Exam: Body mass index is 41.16 kg/m. Vitals:  07/17/23 1001  BP: (!) 140/78   General:  Well developed, well nourished, no apparent distress, normal affect, antalgic gait with a cane, presents in a wheelchair  HEENT: Head normocephalic, atraumatic, PERRL.   Abdomen: Soft, non tender, non distended, Bowel sounds present.  Heart: Examination of the heart reveals regular, rate, and rhythm. There is no murmur noted on ascultation. There is a normal apical pulse.  Lungs: Lungs are clear to auscultation. There is no wheeze, rhonchi, or crackles. There is normal expansion of bilateral chest walls.   Left hip exam  SKIN: intact SWELLING: none  WARMTH: no warmth TENDERNESS: none, Stinchfield Positive ROM: 0 degrees internal rotation and 10 degrees external rotation and pain with internal rotation,; Hip Flexion 80 STRENGTH: normal GAIT: Unable to ambulate due to severe STABILITY: stable to testing CREPITUS: Yes LEG LENGTH DISCREPANCY: Left leg 5mm shorter than right NEUROLOGICAL EXAM: normal VASCULAR  EXAM: normal LUMBAR SPINE: tenderness: no straight leg raising sign: no motor exam: normal  The contralateral hip was examined for comparison and it showed: TENDERNESS: none ROM: normal and full STRENGTH: normal STABILITY: stable to testing  Hip Imaging :  Reviewed AP pelvis and lateral left hip x-rays performed on 05/24/2023 images reviewed by myself. Patient has severe arthritis of the left hip with complete obliteration of the joint space and collapse of the superior femoral head with cystic changes, sclerosis and osteophyte formation. The right hip is status post total hip arthroplasty components in unchanged position compared to prior films there is some evidence of new potential osteolysis around the medial calcar tensional pedestal formation no other signs of periprosthetic fracture or loosening.  Assessment:  Severe left hip osteoarthritis  Plan: Cheikh is a 58 year old male severe left hip arthritis. He has complete loss of joint space throughout the left hip with deformity/collapse to the femoral head. He has had no relief with conservative treatment and his pain is to the point where he is unable to perform activities of daily living. 6, benefits, complications of a left anterior total hip arthroplasty have been discussed with the patient and patient has agreed and consented the procedure with Dr. Clyda Dark on 08/01/2023.  The hospitalization and post-operative care and rehabilitation were also discussed. The use of perioperative antibiotics and DVT prophylaxis were discussed. The risk, benefits and alternatives to a surgical intervention were discussed at length with the patient. The patient was also advised of risks related to the medical comorbidities and elevated body mass index (BMI). A lengthy discussion took place to review the most common complications including but not limited to: deep vein thrombosis, pulmonary embolus, heart attack, stroke, infection, wound breakdown, heterotopic  ossification, dislocation, numbness, leg length in-equality, intraoperative fracture, damage to nerves, tendon,muscles, arteries or other blood vessels, death and other possible complications from anesthesia. The patient was told that we will take steps to minimize these risks by using sterile technique, antibiotics and DVT prophylaxis when appropriate and follow the patient postoperatively in the office setting to monitor progress. The possibility of recurrent pain, no improvement in pain and actual worsening of pain were also discussed with the patient. The risk of dislocation following total hip replacement was discussed and potential precautions to prevent dislocation were reviewed.   All questions answered patient agrees with above plan for left anterior total hip arthroplasty.

## 2023-08-01 NOTE — TOC CM/SW Note (Cosign Needed)
 Patient is not able to walk the distance required to go the bathroom, or he/she is unable to safely negotiate stairs required to access the bathroom.  A 3in1 BSC will alleviate this problem

## 2023-08-01 NOTE — TOC Initial Note (Signed)
 Transition of Care [facility]) - Initial/Assessment Note    Patient Details  Name: Bradley Shaw MRN: [id number] Date of Birth: 1966-03-10  [facility] of [facility]) CM/SW Contact:    Alvera Singh, RN Phone Number: 08/01/2023, 3:21 PM  Clinical Narrative:                 Patient set up with [facility] prior with surgeon office prior to surgery. [employer] delivered DME to patient at bedside. No additional TOC needs identified.   Expected Discharge Plan: Home w Home Health Services Barriers to Discharge: Continued Medical Work up   Patient Goals and CMS Choice Patient states their goals for this hospitalization and ongoing recovery are:: go home          Expected Discharge Plan and Services     Post Acute Care Choice: Durable Medical Equipment, Home Health Living arrangements for the past 2 months: Apartment                   DME Agency: [employer] Date DME Agency Contacted: 08/01/23 Time DME Agency Contacted: 1520 Representative spoke with at DME Agency: Cletis [place] HH Arranged: PT HH Agency: [facility] Date HH Agency Contacted: 08/01/23 Time HH Agency Contacted: 1520 Representative spoke with at [facility] Agency: Shaun  Prior Living Arrangements/Services Living arrangements for the past 2 months: Apartment Lives with:: Spouse Patient language and need for interpreter reviewed:: No Do you feel safe going back to the place where you live?: Yes      Need for Family Participation in Patient Care: No (Comment) Care giver support system in place?: Yes (comment) Current home services: DME (single point cane) Criminal Activity/Legal Involvement Pertinent to Current Situation/Hospitalization: No - Comment as needed  Activities of Daily Living   ADL Screening (condition at time of admission) Independently performs ADLs?: Yes (appropriate for developmental age) Is the patient deaf or have difficulty hearing?: No Does the patient have difficulty seeing,  even when wearing glasses/contacts?: No Does the patient have difficulty concentrating, remembering, or making decisions?: No  Permission Sought/Granted                  Emotional Assessment Appearance:: Appears stated age         Psych Involvement: No (comment)  Admission diagnosis:  Primary localized osteoarthritis of left hip [M16.12] S/P total left hip arthroplasty [W09.811] Patient Active Problem List   Diagnosis Date Noted   S/P total left hip arthroplasty 08/01/2023   Chest pain 05/30/2021   HLD (hyperlipidemia) 05/30/2021   Lung nodule 05/30/2021   Obesity 08/27/2019   Tobacco use 08/27/2019   Stroke (HCC) 05/01/2019   Prostate Cancer  (Large Volume Moderate Risk) 05/01/2016   Elevated PSA 04/23/2016   Erectile dysfunction due to arterial insufficiency 04/23/2016   Obesity with body mass index (BMI) of 30.0 to 39.9 12/28/2015   Medial meniscus tear 07/17/2012   Patellofemoral arthritis of left knee 04/28/2012   Left knee pain 03/31/2012   Hip arthritis 07/20/2011   Pain in joint involving lower leg 06/08/2011   Primary localized osteoarthrosis, pelvic region and thigh 06/08/2011   Sprain and strain of unspecified site of knee and leg 06/08/2011   PCP:  Dione Housekeeper, MD Pharmacy:   [facility] [phone] [facility] - [address] [place] [address] Phone: [phone] Fax: [phone]     Social Drivers of Health (SDOH) Social History: SDOH Screenings   Food Insecurity: No Food Insecurity (08/01/2023)  Housing: Low Risk  (08/01/2023)  Transportation Needs: No Transportation Needs (08/01/2023)  Utilities: Not At Risk (08/01/2023)  Depression (PHQ2-9): Low Risk  (06/03/2019)  Financial Resource Strain: Low Risk  (05/24/2023)   Received from [facility]  Tobacco Use: High Risk (08/01/2023)   SDOH Interventions:     Readmission Risk Interventions     No data to display

## 2023-08-01 NOTE — Anesthesia Postprocedure Evaluation (Signed)
 Anesthesia Post Note  Patient: Bradley Shaw  Procedure(s) Performed: ARTHROPLASTY, HIP, TOTAL, ANTERIOR APPROACH (Left: Hip)  Patient location during evaluation: Nursing Unit Anesthesia Type: Spinal Level of consciousness: oriented and awake and alert Pain management: pain level controlled Vital Signs Assessment: post-procedure vital signs reviewed and stable Respiratory status: spontaneous breathing and respiratory function stable Cardiovascular status: blood pressure returned to baseline and stable Postop Assessment: no headache, no backache, no apparent nausea or vomiting and patient able to bend at knees Anesthetic complications: no   No notable events documented.   Last Vitals:  Vitals:   08/01/23 1225 08/01/23 1230  BP:  130/75  Pulse: 96 87  Resp: 15 13  Temp: 36.4 C   SpO2: 96% 94%    Last Pain:  Vitals:   08/01/23 1230  TempSrc:   PainSc: 5                  Baltazar Bonier

## 2023-08-01 NOTE — Evaluation (Signed)
 Physical Therapy Evaluation Patient Details Name: Bradley Shaw MRN: [id number] DOB: [birth year] Today's Date: 08/01/2023  History of Present Illness  Bradley Shaw is an 58 y.o. male presents for history and physical for left anterior total hip arthroplasty with Dr. Clyda Dark on 08/01/2023.  Clinical Impression  Patient received in bed arriving to PO unit. He is eager to get moving. Patient is mod I with bed mobility. Increased effort and time. He performed sit to stand with cues and cga. Ambulated 35 feet in room with  Rw and cga. Patient will continue to benefit from skilled PT to improve independence, safety and endurance.        If plan is discharge home, recommend the following: A little help with walking and/or transfers;A little help with bathing/dressing/bathroom;Assist for transportation;Help with stairs or ramp for entrance;Assistance with cooking/housework   Can travel by private vehicle    yes    Equipment Recommendations Rolling walker (2 wheels);BSC/3in1  Recommendations for Other Services       Functional Status Assessment Patient has had a recent decline in their functional status and demonstrates the ability to make significant improvements in function in a reasonable and predictable amount of time.     Precautions / Restrictions Precautions Precautions: Fall;Anterior Hip Precaution Booklet Issued: No Recall of Precautions/Restrictions: Intact Restrictions Weight Bearing Restrictions Per Provider Order: Yes LLE Weight Bearing Per Provider Order: Weight bearing as tolerated      Mobility  Bed Mobility Overal bed mobility: Modified Independent             General bed mobility comments: increased time and effort needed    Transfers Overall transfer level: Needs assistance Equipment used: Rolling walker (2 wheels) Transfers: Sit to/from Stand Sit to Stand: Contact guard assist           General transfer comment: cues for hand placement     Ambulation/Gait Ambulation/Gait assistance: Contact guard assist Gait Distance (Feet): 35 Feet Assistive device: Rolling walker (2 wheels) Gait Pattern/deviations: Step-to pattern, Decreased step length - right, Decreased step length - left, Decreased stride length, Trunk flexed Gait velocity: decr     General Gait Details: cues needed for sequencing, safe use of AD, WB status  Stairs            Wheelchair Mobility     Tilt Bed    Modified Rankin (Stroke Patients Only)       Balance Overall balance assessment: Needs assistance Sitting-balance support: Feet supported Sitting balance-Leahy Scale: Good     Standing balance support: Bilateral upper extremity supported, During functional activity, Reliant on assistive device for balance Standing balance-Leahy Scale: Good Standing balance comment: able to static stand without UE support, benefits from at least single UE support due to pain                             Pertinent Vitals/Pain Pain Assessment Pain Assessment: 0-10 Pain Score: 4  Pain Location: L hip Pain Descriptors / Indicators: Discomfort, Sore Pain Intervention(s): Monitored during session, Repositioned, Premedicated before session, Ice applied    Home Living Family/patient expects to be discharged to:: Private residence Living Arrangements: Spouse/significant other;Children Available Help at Discharge: Family;Available 24 hours/day Type of Home: Apartment Home Access: Stairs to enter Entrance Stairs-Rails: Right;Left;Can reach both Entrance Stairs-Number of Steps: 22   Home Layout: One level Home Equipment: Cane - single point      Prior Function Prior Level of Function :  Independent/Modified Independent;Driving;Working/employed             Mobility Comments: patient was using cane and working prior to surgery ADLs Comments: mod I     Extremity/Trunk Assessment   Upper Extremity Assessment Upper Extremity Assessment:  Overall WFL for tasks assessed    Lower Extremity Assessment Lower Extremity Assessment: LLE deficits/detail LLE Deficits / Details: WNL for THA LLE Coordination: decreased gross motor    Cervical / Trunk Assessment Cervical / Trunk Assessment: Normal  Communication   Communication Communication: No apparent difficulties    Cognition Arousal: Alert Behavior During Therapy: WFL for tasks assessed/performed   PT - Cognitive impairments: No apparent impairments                         Following commands: Intact       Cueing Cueing Techniques: Verbal cues     General Comments      Exercises Total Joint Exercises Ankle Circles/Pumps: AROM, Both, 5 reps Long Arc Quad: AROM, Both, 5 reps   Assessment/Plan    PT Assessment Patient needs continued PT services  PT Problem List Decreased strength;Decreased range of motion;Decreased activity tolerance;Decreased balance;Decreased mobility;Decreased knowledge of use of DME;Decreased safety awareness;Decreased knowledge of precautions;Decreased skin integrity;Pain;Obesity       PT Treatment Interventions DME instruction;Gait training;Stair training;Functional mobility training;Therapeutic activities;Therapeutic exercise;Balance training;Patient/family education    PT Goals (Current goals can be found in the Care Plan section)  Acute Rehab PT Goals Patient Stated Goal: return home tomorrow PT Goal Formulation: With patient/family Time For Goal Achievement: 08/03/23 Potential to Achieve Goals: Good    Frequency BID     Co-evaluation               AM-PAC PT "6 Clicks" Mobility  Outcome Measure Help needed turning from your back to your side while in a flat bed without using bedrails?: A Little Help needed moving from lying on your back to sitting on the side of a flat bed without using bedrails?: A Little Help needed moving to and from a bed to a chair (including a wheelchair)?: A Little Help needed  standing up from a chair using your arms (e.g., wheelchair or bedside chair)?: A Little Help needed to walk in hospital room?: A Little Help needed climbing 3-5 steps with a railing? : A Little 6 Click Score: 18    End of Session   Activity Tolerance: Patient tolerated treatment well Patient left: in chair;with call bell/phone within reach;with family/visitor present Nurse Communication: Mobility status PT Visit Diagnosis: Other abnormalities of gait and mobility (R26.89);Muscle weakness (generalized) (M62.81);Difficulty in walking, not elsewhere classified (R26.2);Pain Pain - Right/Left: Left Pain - part of body: Hip    Time: 1610-9604 PT Time Calculation (min) (ACUTE ONLY): 27 min   Charges:   PT Evaluation $PT Eval Moderate Complexity: 1 Mod PT Treatments $Gait Training: 8-22 mins PT General Charges $$ ACUTE PT VISIT: 1 Visit         Nyheim Seufert, PT, GCS 08/01/23,1:58 PM

## 2023-08-02 ENCOUNTER — Encounter: Payer: Self-pay | Admitting: Orthopedic Surgery

## 2023-08-02 DIAGNOSIS — M1612 Unilateral primary osteoarthritis, left hip: Secondary | ICD-10-CM | POA: Diagnosis not present

## 2023-08-02 LAB — BASIC METABOLIC PANEL WITH GFR
Anion gap: 7 (ref 5–15)
BUN: 21 mg/dL — ABNORMAL HIGH (ref 6–20)
CO2: 22 mmol/L (ref 22–32)
Calcium: 8.7 mg/dL — ABNORMAL LOW (ref 8.9–10.3)
Chloride: 103 mmol/L (ref 98–111)
Creatinine, Ser: 0.91 mg/dL (ref 0.61–1.24)
GFR, Estimated: >60 mL/min (ref >60)
Glucose, Bld: 119 mg/dL — ABNORMAL HIGH (ref 70–99)
Potassium: 4.3 mmol/L (ref 3.5–5.1)
Sodium: 132 mmol/L — ABNORMAL LOW (ref 135–145)

## 2023-08-02 LAB — CBC
HCT: 34.3 % — ABNORMAL LOW (ref 39.0–52.0)
Hemoglobin: 11.8 g/dL — ABNORMAL LOW (ref 13.0–17.0)
MCH: 31.1 pg (ref 26.0–34.0)
MCHC: 34.4 g/dL (ref 30.0–36.0)
MCV: 90.3 fL (ref 80.0–100.0)
Platelets: 183 10*3/uL (ref 150–400)
RBC: 3.8 MIL/uL — ABNORMAL LOW (ref 4.22–5.81)
RDW: 13.9 % (ref 11.5–15.5)
WBC: 9.8 10*3/uL (ref 4.0–10.5)
nRBC: 0 % (ref 0.0–0.2)

## 2023-08-02 MED ORDER — ATORVASTATIN CALCIUM 20 MG PO TABS
ORAL_TABLET | ORAL | Status: AC
  Filled 2023-08-02: qty 4

## 2023-08-02 MED ORDER — ENOXAPARIN SODIUM 40 MG/0.4ML IJ SOSY: 40.0000 mg | PREFILLED_SYRINGE | INTRAMUSCULAR | 0 refills | Status: AC

## 2023-08-02 MED ORDER — OXYCODONE HCL 5 MG PO TABS
5.0000 mg | ORAL_TABLET | Freq: Three times a day (TID) | ORAL | 0 refills | Status: AC | PRN
Start: 2023-08-02 — End: 2024-08-01

## 2023-08-02 NOTE — Progress Notes (Signed)
 Nsg Discharge Note  Admit Date:  08/01/2023 Discharge date: 08/02/2023   Bradley Shaw to be D/C'd Home per MD order.  AVS completed.   Patient/caregiver able to verbalize understanding.  Discharge Medication: Allergies as of 08/02/2023       Reactions   Shellfish Allergy Anaphylaxis   Hives & throat closes        Medication List     TAKE these medications    acetaminophen  500 MG tablet Commonly known as: TYLENOL  Take 1,000 mg by mouth every 8 (eight) hours as needed for moderate pain (pain score 4-6).   Aspercreme Lidocaine  4 % Crea Generic drug: Lidocaine  HCl Apply topically as needed.   atorvastatin  80 MG tablet Commonly known as: LIPITOR  Take 80 mg by mouth daily.   BLACK ELDERBERRY PO Take 1 each by mouth daily.   CLEAR EYES COMPLETE OP Place 1 drop into both eyes 3 (three) times daily as needed (dry eyes).   enoxaparin  40 MG/0.4ML injection Commonly known as: LOVENOX  Inject 0.4 mLs (40 mg total) into the skin daily for 14 days. Start taking on: August 03, 2023   meloxicam 15 MG tablet Commonly known as: MOBIC Take 15 mg by mouth daily as needed for pain.   oxyCODONE  5 MG immediate release tablet Commonly known as: Roxicodone  Take 1 tablet (5 mg total) by mouth every 8 (eight) hours as needed.   tamsulosin  0.4 MG Caps capsule Commonly known as: FLOMAX  Take 1 capsule (0.4 mg total) by mouth daily after supper.   tiZANidine 2 MG tablet Commonly known as: ZANAFLEX Take 2 mg by mouth every 8 (eight) hours as needed for muscle spasms.   TURMERIC CURCUMIN PO Take 1 capsule by mouth daily.               Durable Medical Equipment  (From admission, onward)           Start     Ordered   08/01/23 1239  For home use only DME Bedside commode  Once       Question:  Patient needs a bedside commode to treat with the following condition  Answer:  Osteoarthritis of left hip, unspecified osteoarthritis type   08/01/23 1239   08/01/23 1238  For home  use only DME Walker rolling  Once       Question Answer Comment  Walker: With 5 Inch Wheels   Patient needs a walker to treat with the following condition Osteoarthritis of left hip, unspecified osteoarthritis type      08/01/23 1239            Discharge Assessment: Vitals:   08/02/23 0815 08/02/23 1128  BP: (!) 159/89 131/78  Pulse: 85 84  Resp: 14 16  Temp: 97.6 F (36.4 C) 98.2 F (36.8 C)  SpO2: 98% 98%   Skin clean, dry and intact without evidence of skin break down, no evidence of skin tears noted. IV catheter discontinued intact. Site without signs and symptoms of complications - no redness or edema noted at insertion site, patient denies c/o pain - only slight tenderness at site.  Dressing with slight pressure applied.  D/c Instructions-Education: Discharge instructions given to patient/family with verbalized understanding. D/c education completed with patient/family including follow up instructions, medication list, d/c activities limitations if indicated, with other d/c instructions as indicated by MD - patient able to verbalize understanding, all questions fully answered. Patient instructed to return to ED, call 911, or call MD for any changes in condition.  Patient escorted via WC, and D/C home via private auto.  Lamarr Pilling, RN 08/02/2023 4:08 PM

## 2023-08-02 NOTE — Discharge Summary (Signed)
 Physician Discharge Summary  Subjective: 1 Day Post-Op Procedure(s) (LRB): ARTHROPLASTY, HIP, TOTAL, ANTERIOR APPROACH (Left) Patient reports pain as mild.   Patient seen in rounds with Dr. Clyda Dark. Patient is well, and has had no acute complaints or problems.  Denies any CP, SOB, N/B, fevers or chills. We continue therapy today.  Patient is ready to go home  Physician Discharge Summary  Patient ID: Bradley Shaw MRN: [id number] DOB/AGE: [birth year] 59 y.o.  Admit date: 08/01/2023 Discharge date: 08/02/2023  Admission Diagnoses:  Discharge Diagnoses:  Principal Problem:   S/P total left hip arthroplasty   Discharged Condition: good  Hospital Course: Patient presented to the hospital on 08/01/2023 for an elective left hip arthroplasty with anterior approach performed by Dr. Clyda Dark. Patient was given 1g of TXA and 2g of Ancef  prior to the procedure. he  tolerated the procedure well without any complications. See procedural note below for details. Postoperatively, the patient did very well. he  was able to pass PT protocols on post-op day one without any issues. He was able to void his bladder without any difficulty. Physical exam was unremarkable. he  denies any SOB, CP, N/V, fevers or chills. Vital signs are stable. Patient is stable to discharge home.  Procedure: Left Total Hip Arthroplasty   Components/Implants: Cup: Trident Tritanium Clusterhole 52/E w/x 2 screws    Liner: Neutral X3 Poly 36/E  Stem: Insignia #2 std offset  Head:Biolox ceramic 36mm +0   Date of Surgery: 08/01/2023   Surgeon: Venus Ginsberg MD   Assistant: [facility] RNFA (present and scrubbed throughout the case, critical for assistance with exposure, retraction, instrumentation, and closure)   Anesthesiologist: Lincoln Renshaw   Anesthesia: Spinal    EBL: 100cc   IVF:700cc   Complications: None  Treatments: none  Discharge Exam: Blood pressure 131/78, pulse 84, temperature 98.2 F (36.8 C), temperature  source Temporal, resp. rate 16, height 5\' 6"  (1.676 m), weight 119.6 kg, SpO2 98%.  Disposition: home   Allergies as of 08/02/2023       Reactions   Shellfish Allergy Anaphylaxis   Hives & throat closes        Medication List     TAKE these medications    acetaminophen  500 MG tablet Commonly known as: TYLENOL  Take 1,000 mg by mouth every 8 (eight) hours as needed for moderate pain (pain score 4-6).   Aspercreme Lidocaine  4 % Crea Generic drug: Lidocaine  HCl Apply topically as needed.   atorvastatin  80 MG tablet Commonly known as: LIPITOR  Take 80 mg by mouth daily.   BLACK ELDERBERRY PO Take 1 each by mouth daily.   CLEAR EYES COMPLETE OP Place 1 drop into both eyes 3 (three) times daily as needed (dry eyes).   enoxaparin  40 MG/0.4ML injection Commonly known as: LOVENOX  Inject 0.4 mLs (40 mg total) into the skin daily for 14 days. Start taking on: August 03, 2023   meloxicam 15 MG tablet Commonly known as: MOBIC Take 15 mg by mouth daily as needed for pain.   oxyCODONE  5 MG immediate release tablet Commonly known as: Roxicodone  Take 1 tablet (5 mg total) by mouth every 8 (eight) hours as needed.   tamsulosin  0.4 MG Caps capsule Commonly known as: FLOMAX  Take 1 capsule (0.4 mg total) by mouth daily after supper.   tiZANidine 2 MG tablet Commonly known as: ZANAFLEX Take 2 mg by mouth every 8 (eight) hours as needed for muscle spasms.   TURMERIC CURCUMIN PO Take 1 capsule by mouth daily.  Durable Medical Equipment  (From admission, onward)           Start     Ordered   08/01/23 1239  For home use only DME Bedside commode  Once       Question:  Patient needs a bedside commode to treat with the following condition  Answer:  Osteoarthritis of left hip, unspecified osteoarthritis type   08/01/23 1239   08/01/23 1238  For home use only DME Walker rolling  Once       Question Answer Comment  Walker: With 5 Inch Wheels   Patient needs  a walker to treat with the following condition Osteoarthritis of left hip, unspecified osteoarthritis type      08/01/23 1239             Signed: Jerrilyn Moras Talar Fraley 08/02/2023, 11:58 AM   Objective: Vital signs in last 24 hours: Temp:  [97.6 F (36.4 C)-98.2 F (36.8 C)] 98.2 F (36.8 C) (04/18 1128) Pulse Rate:  [80-96] 84 (04/18 1128) Resp:  [13-20] 16 (04/18 1128) BP: (112-159)/(66-91) 131/78 (04/18 1128) SpO2:  [91 %-98 %] 98 % (04/18 1128)  Intake/Output from previous day:  Intake/Output Summary (Last 24 hours) at 08/02/2023 1158 Last data filed at 08/02/2023 1036 Gross per 24 hour  Intake 1515 ml  Output 1150 ml  Net 365 ml    Intake/Output this shift: Total I/O In: 240 [P.O.:240] Out: -   Labs: Recent Labs    08/02/23 0611  HGB 11.8*   Recent Labs    08/02/23 0611  WBC 9.8  RBC 3.80*  HCT 34.3*  PLT 183   Recent Labs    08/02/23 0611  NA 132*  K 4.3  CL 103  CO2 22  BUN 21*  CREATININE 0.91  GLUCOSE 119*  CALCIUM  8.7*   No results for input(s): "LABPT", "INR" in the last 72 hours.  EXAM: General - Patient is Alert, Appropriate, and Oriented Extremity - Neurologically intact Neurovascular intact Sensation intact distally Intact pulses distally Dorsiflexion/Plantar flexion intact No cellulitis present Compartment soft Dressing - dressing C/D/I and no drainage Motor Function - intact, moving foot and toes well on exam.   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   Weight as of this encounter: 119.6 kg.  Patient will continue to work with physical therapy   Hip Preacutions   Discussed with the patient continuing to utilize ice over the bandage   Patient will wear TED hose bilaterally to help prevent DVT and clot formation   Discussed the Aquacel bandage.  This bandage will stay in place 7 days postoperatively.  Can be replaced with honeycomb bandages that will be sent home with the patient   Discussed sending the patient home with oxycodone  for as needed pain management.  Patient will take Lovenox  once daily for DVT prophylaxis   Weight-Bearing as tolerated to left leg  Patient will follow-up with [facility] orthopedics in 2 weeks for reevaluation  Diet - Regular diet Follow up - in 2 weeks Activity - WBAT Disposition - Home Condition Upon Discharge - Good DVT Prophylaxis - Lovenox  and TED hose  Standley Earing, PA-C Orthopaedic Surgery 08/02/2023, 11:58 AM

## 2023-08-02 NOTE — Progress Notes (Signed)
 Physical Therapy Treatment Patient Details Name: Bradley Shaw MRN: [id number] DOB: February 03, 1966 Today's Date: 08/02/2023   History of Present Illness Bradley Shaw is an 58 y.o. male presents for history and physical for left anterior total hip arthroplasty with Dr. Lorelle on 08/01/2023.    PT Comments  Patient received in recliner, he is pleasant and ready for PT. Patient stands with supervision. He is able to ambulate 200 feet with RW and ambulated up/down 4 steps x 2 reps with B Rails and supervision. Patient progressing well and is ready to go home.       If plan is discharge home, recommend the following: A little help with walking and/or transfers;A little help with bathing/dressing/bathroom;Assist for transportation;Help with stairs or ramp for entrance;Assistance with cooking/housework   Can travel by private vehicle      yes  Equipment Recommendations  Rolling walker (2 wheels);BSC/3in1    Recommendations for Other Services       Precautions / Restrictions Precautions Precautions: Fall;Anterior Hip Precaution Booklet Issued: Yes (comment) Recall of Precautions/Restrictions: Intact Restrictions Weight Bearing Restrictions Per Provider Order: Yes LLE Weight Bearing Per Provider Order: Weight bearing as tolerated     Mobility  Bed Mobility               General bed mobility comments: NT patient in recliner    Transfers Overall transfer level: Needs assistance Equipment used: Rolling walker (2 wheels) Transfers: Sit to/from Stand Sit to Stand: Supervision           General transfer comment: good recall for hand placement with transfers    Ambulation/Gait Ambulation/Gait assistance: Supervision Gait Distance (Feet): 200 Feet Assistive device: Rolling walker (2 wheels)   Gait velocity: decr     General Gait Details: cues needed for sequencing, safe use of AD, WB status   Stairs Stairs: Yes Stairs assistance: Supervision Stair Management: Two  rails, Forwards Number of Stairs: 4 General stair comments: Ambulated up/down 4 steps x 2 with supervision and cues   Wheelchair Mobility     Tilt Bed    Modified Rankin (Stroke Patients Only)       Balance Overall balance assessment: Modified Independent Sitting-balance support: Feet supported Sitting balance-Leahy Scale: Normal     Standing balance support: Bilateral upper extremity supported, During functional activity, Reliant on assistive device for balance Standing balance-Leahy Scale: Good Standing balance comment: able to static stand without UE support, benefits from at least single UE support due to pain                            Communication Communication Communication: No apparent difficulties  Cognition Arousal: Alert     PT - Cognitive impairments: No apparent impairments                         Following commands: Intact      Cueing Cueing Techniques: Verbal cues  Exercises Total Joint Exercises Ankle Circles/Pumps: AROM, Both, 5 reps Quad Sets: AROM, Both, 5 reps Gluteal Sets: AROM, Both, 5 reps Towel Squeeze: AROM, Both, 5 reps Short Arc Quad: AROM, Left, 5 reps Heel Slides: AROM, Left, 5 reps Hip ABduction/ADduction: AROM, Left, 5 reps Straight Leg Raises: AROM, Left, 5 reps Long Arc Quad: AROM, Left, 5 reps    General Comments        Pertinent Vitals/Pain Pain Assessment Pain Assessment: Faces Faces Pain Scale: Hurts little more Pain  Location: L hip Pain Descriptors / Indicators: Discomfort, Sore, Tightness, Grimacing Pain Intervention(s): Monitored during session, Premedicated before session, Repositioned, Ice applied    Home Living Family/patient expects to be discharged to:: Private residence Living Arrangements: Spouse/significant other;Children                      Prior Function            PT Goals (current goals can now be found in the care plan section) Acute Rehab PT Goals Patient  Stated Goal: return home PT Goal Formulation: With patient Time For Goal Achievement: 08/03/23 Potential to Achieve Goals: Good Progress towards PT goals: Progressing toward goals    Frequency    BID      PT Plan      Co-evaluation              AM-PAC PT 6 Clicks Mobility   Outcome Measure  Help needed turning from your back to your side while in a flat bed without using bedrails?: A Little Help needed moving from lying on your back to sitting on the side of a flat bed without using bedrails?: A Little Help needed moving to and from a bed to a chair (including a wheelchair)?: None Help needed standing up from a chair using your arms (e.g., wheelchair or bedside chair)?: None Help needed to walk in hospital room?: A Little Help needed climbing 3-5 steps with a railing? : A Little 6 Click Score: 20    End of Session   Activity Tolerance: Patient tolerated treatment well Patient left: in chair;with call bell/phone within reach Nurse Communication: Mobility status PT Visit Diagnosis: Other abnormalities of gait and mobility (R26.89);Muscle weakness (generalized) (M62.81);Difficulty in walking, not elsewhere classified (R26.2);Pain Pain - Right/Left: Left Pain - part of body: Hip     Time: 9161-9098 PT Time Calculation (min) (ACUTE ONLY): 23 min  Charges:    $Gait Training: 8-22 mins $Therapeutic Exercise: 8-22 mins PT General Charges $$ ACUTE PT VISIT: 1 Visit                     Juanito Gonyer, PT, GCS 08/02/23,9:14 AM

## 2023-08-02 NOTE — Plan of Care (Signed)
 Documented

## 2023-08-02 NOTE — Progress Notes (Signed)
 Subjective: 1 Day Post-Op Procedure(s) (LRB): ARTHROPLASTY, HIP, TOTAL, ANTERIOR APPROACH (Left) Patient reports pain as mild.   Patient seen in rounds with Dr. Clyda Dark. Patient is well, and has had no acute complaints or problems.  Denies any CP, SOB, N/B, fevers or chills. We will start therapy today.  Plan is to go Home after hospital stay.  Objective: Vital signs in last 24 hours: Temp:  [97.6 F (36.4 C)-98.2 F (36.8 C)] 98.2 F (36.8 C) (04/18 1128) Pulse Rate:  [80-96] 84 (04/18 1128) Resp:  [13-20] 16 (04/18 1128) BP: (112-159)/(66-91) 131/78 (04/18 1128) SpO2:  [91 %-98 %] 98 % (04/18 1128)  Intake/Output from previous day:  Intake/Output Summary (Last 24 hours) at 08/02/2023 1136 Last data filed at 08/02/2023 1036 Gross per 24 hour  Intake 1515 ml  Output 1150 ml  Net 365 ml    Intake/Output this shift: Total I/O In: 240 [P.O.:240] Out: -   Labs: Recent Labs    08/02/23 0611  HGB 11.8*   Recent Labs    08/02/23 0611  WBC 9.8  RBC 3.80*  HCT 34.3*  PLT 183   Recent Labs    08/02/23 0611  NA 132*  K 4.3  CL 103  CO2 22  BUN 21*  CREATININE 0.91  GLUCOSE 119*  CALCIUM  8.7*   No results for input(s): "LABPT", "INR" in the last 72 hours.  EXAM General - Patient is Alert, Appropriate, and Oriented Extremity - Neurologically intact Neurovascular intact Sensation intact distally Intact pulses distally Dorsiflexion/Plantar flexion intact No cellulitis present Compartment soft Dressing - dressing C/D/I and no drainage Motor Function - intact, moving foot and toes well on exam.   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   Weight as of this encounter: 119.6 kg. Advance diet Up with therapy  Patient will continue to work with physical therapy to pass postoperative PT protocols, ROM and strengthening  Hip Preacutions  Discussed with the patient continuing to utilize ice over the bandage  Patient will wear TED hose bilaterally to help prevent DVT and clot formation  Discussed the Aquacel bandage.  This bandage will stay in place 7 days postoperatively.  Can be replaced with honeycomb bandages that will be sent home with the patient  Discussed sending the patient home with oxycodone  for as needed pain management.  Patient will take Lovenox  once daily for DVT prophylaxis  Weight-Bearing as tolerated to left leg  Patient will follow-up with [facility] clinic orthopedics in 2 weeks for reevaluation   Wadie Guile, PA-C [facility] 08/02/2023, 11:36 AM

## 2023-08-02 NOTE — TOC Transition Note (Signed)
 Transition of Care [facility]) - Discharge Note   Patient Details  Name: Bradley Shaw MRN: [id number] Date of Birth: 1966/01/25  Transition of [facility]) CM/SW Contact:  Alexandra Ice, RN Phone Number: 08/02/2023, 1:09 PM   Clinical Narrative:     Patient to discharge home with home health services. [facility] set up by surgeon office prior to surgery. DME deliver by [employer] to bedside. No additional TOC needs identified    Barriers to Discharge: Continued Medical Work up   Patient Goals and CMS Choice Patient states their goals for this hospitalization and ongoing recovery are:: go home          Discharge Placement                       Discharge Plan and Services Additional resources added to the After Visit Summary for       Post Acute Care Choice: Durable Medical Equipment, Home Health            DME Agency: [employer] Date DME Agency Contacted: 08/01/23 Time DME Agency Contacted: 1520 Representative spoke with at DME Agency: Sam Creighton HH Arranged: PT HH Agency: [facility] Date HH Agency Contacted: 08/01/23 Time HH Agency Contacted: 1520 Representative spoke with at [facility] Agency: Shaun  Social Drivers of Health (SDOH) Interventions SDOH Screenings   Food Insecurity: No Food Insecurity (08/01/2023)  Housing: Low Risk  (08/01/2023)  Transportation Needs: No Transportation Needs (08/01/2023)  Utilities: Not At Risk (08/01/2023)  Depression (PHQ2-9): Low Risk  (06/03/2019)  Financial Resource Strain: Low Risk  (05/24/2023)   Received from [facility]  Tobacco Use: High Risk (08/01/2023)     Readmission Risk Interventions     No data to display

## 2023-08-02 NOTE — Discharge Instructions (Addendum)
 Instructions after Anterior Total Hip Replacement        Dr. Regenia Skeeter., M.D.      Dept. of Orthopaedics & Sports Medicine  [facility]  [address]  [place], [place]  [address]  Phone: [phone]   Fax: [phone]    DIET: Drink plenty of non-alcoholic fluids. Resume your normal diet. Include foods high in fiber.  ACTIVITY:  You may use crutches or a walker with weight-bearing as tolerated, unless instructed otherwise. You may be weaned off of the walker or crutches by your Physical Therapist.  Continue doing gentle exercises. Exercising will reduce the pain and swelling, increase motion, and prevent muscle weakness.   Please continue to use the TED compression stockings for 2 weeks. You may remove the stockings at night, but should reapply them in the morning. Do not drive or operate any equipment until instructed.  WOUND CARE:  Continue to use ice packs periodically to reduce pain and swelling. You may shower with honeycomb dressing 3 days after your surgery. Do not submerge incision site under water. Remove honeycomb dressing 7 days after surgery and allow dermabond to fall off on its own.   MEDICATIONS: You may resume your regular medications. Please take the pain medication as prescribed on the medication list. Do not take pain medication on an empty stomach. You have been given a prescription for a blood thinner to prevent blood clots. Please take the medication as instructed. (NOTE: After completing a 2 week course of Lovenox, take one Enteric-coated 81 mg aspirin twice a day for 3 additional weeks.) Pain medications and iron supplements can cause constipation. Use a stool softener (Senokot or Colace) on a daily basis and a laxative (dulcolax or miralax) as needed. Do not drive or drink alcoholic beverages when taking pain medications.  POSTOPERATIVE CONSTIPATION PROTOCOL Constipation - defined medically as fewer than three stools per week and  severe constipation as less than one stool per week.  One of the most common issues patients have following surgery is constipation.  Even if you have a regular bowel pattern at home, your normal regimen is likely to be disrupted due to multiple reasons following surgery.  Combination of anesthesia, postoperative narcotics, change in appetite and fluid intake all can affect your bowels.  In order to avoid complications following surgery, here are some recommendations in order to help you during your recovery period.  Colace (docusate) - Pick up an over-the-counter form of Colace or another stool softener and take twice a day as long as you are requiring postoperative pain medications.  Take with a full glass of water daily.  If you experience loose stools or diarrhea, hold the colace until you stool forms back up.  If your symptoms do not get better within 1 week or if they get worse, check with your doctor.  Dulcolax (bisacodyl) - Pick up over-the-counter and take as directed by the product packaging as needed to assist with the movement of your bowels.  Take with a full glass of water.  Use this product as needed if not relieved by Colace only.   MiraLax (polyethylene glycol) - Pick up over-the-counter to have on hand.  MiraLax is a solution that will increase the amount of water in your bowels to assist with bowel movements.  Take as directed and can mix with a glass of water, juice, soda, coffee, or tea.  Take if you go more than two days without a movement. Do not use MiraLax more than  once per day. Call your doctor if you are still constipated or irregular after using this medication for 7 days in a row.  If you continue to have problems with postoperative constipation, please contact the office for further assistance and recommendations.  If you experience "the worst abdominal pain ever" or develop nausea or vomiting, please contact the office immediatly for further recommendations for  treatment.   CALL THE OFFICE FOR: Temperature above 101 degrees Excessive bleeding or drainage on the dressing. Excessive swelling, coldness, or paleness of the toes. Persistent nausea and vomiting.  FOLLOW-UP:  You should have an appointment to return to the office in 2 weeks after surgery. Arrangements have been made for continuation of Physical Therapy (either home therapy or outpatient therapy).     [facility] They will call you to set up when they are coming out to see you   [address], [place], [place] [address] Hours:  Open ? Closes 5?PM Phone: [phone]
